# Patient Record
Sex: Female | Born: 1950
Health system: Southern US, Community
[De-identification: ages and names within clinical notes are randomized; demographics above are authoritative.]

## PROBLEM LIST (undated history)

## (undated) DIAGNOSIS — M1990 Unspecified osteoarthritis, unspecified site: Secondary | ICD-10-CM

## (undated) HISTORY — PX: COLONOSCOPY: SHX174

## (undated) HISTORY — PX: PARTIAL HYSTERECTOMY: SHX80

## (undated) HISTORY — PX: LESION EXCISION: SHX5167

## (undated) HISTORY — PX: CHOLECYSTECTOMY: SHX55

## (undated) HISTORY — PX: MASS EXCISION: SHX2000

## (undated) HISTORY — PX: ABDOMINAL HYSTERECTOMY: SHX81

---

## 2006-03-23 ENCOUNTER — Ambulatory Visit: Payer: Self-pay | Admitting: Internal Medicine

## 2016-04-17 DIAGNOSIS — H109 Unspecified conjunctivitis: Secondary | ICD-10-CM | POA: Diagnosis not present

## 2016-04-17 DIAGNOSIS — J069 Acute upper respiratory infection, unspecified: Secondary | ICD-10-CM | POA: Diagnosis not present

## 2016-04-17 DIAGNOSIS — J029 Acute pharyngitis, unspecified: Secondary | ICD-10-CM | POA: Diagnosis not present

## 2016-10-21 DIAGNOSIS — H524 Presbyopia: Secondary | ICD-10-CM | POA: Diagnosis not present

## 2016-10-21 DIAGNOSIS — H2513 Age-related nuclear cataract, bilateral: Secondary | ICD-10-CM | POA: Diagnosis not present

## 2016-10-21 DIAGNOSIS — H52223 Regular astigmatism, bilateral: Secondary | ICD-10-CM | POA: Diagnosis not present

## 2016-10-21 DIAGNOSIS — H5213 Myopia, bilateral: Secondary | ICD-10-CM | POA: Diagnosis not present

## 2016-10-21 DIAGNOSIS — H50011 Monocular esotropia, right eye: Secondary | ICD-10-CM | POA: Diagnosis not present

## 2016-10-21 DIAGNOSIS — H43813 Vitreous degeneration, bilateral: Secondary | ICD-10-CM | POA: Diagnosis not present

## 2017-08-03 DIAGNOSIS — J40 Bronchitis, not specified as acute or chronic: Secondary | ICD-10-CM | POA: Diagnosis not present

## 2017-11-05 DIAGNOSIS — C44319 Basal cell carcinoma of skin of other parts of face: Secondary | ICD-10-CM | POA: Diagnosis not present

## 2017-11-05 DIAGNOSIS — D485 Neoplasm of uncertain behavior of skin: Secondary | ICD-10-CM | POA: Diagnosis not present

## 2017-11-05 DIAGNOSIS — C44311 Basal cell carcinoma of skin of nose: Secondary | ICD-10-CM | POA: Diagnosis not present

## 2017-11-05 DIAGNOSIS — M799 Soft tissue disorder, unspecified: Secondary | ICD-10-CM | POA: Diagnosis not present

## 2017-11-29 DIAGNOSIS — D485 Neoplasm of uncertain behavior of skin: Secondary | ICD-10-CM | POA: Diagnosis not present

## 2017-12-28 DIAGNOSIS — D2111 Benign neoplasm of connective and other soft tissue of right upper limb, including shoulder: Secondary | ICD-10-CM | POA: Diagnosis not present

## 2017-12-28 DIAGNOSIS — R2231 Localized swelling, mass and lump, right upper limb: Secondary | ICD-10-CM | POA: Diagnosis not present

## 2017-12-28 DIAGNOSIS — D487 Neoplasm of uncertain behavior of other specified sites: Secondary | ICD-10-CM | POA: Diagnosis not present

## 2017-12-28 DIAGNOSIS — D481 Neoplasm of uncertain behavior of connective and other soft tissue: Secondary | ICD-10-CM | POA: Diagnosis not present

## 2017-12-28 DIAGNOSIS — E669 Obesity, unspecified: Secondary | ICD-10-CM | POA: Diagnosis not present

## 2017-12-29 DIAGNOSIS — L821 Other seborrheic keratosis: Secondary | ICD-10-CM | POA: Diagnosis not present

## 2017-12-29 DIAGNOSIS — L814 Other melanin hyperpigmentation: Secondary | ICD-10-CM | POA: Diagnosis not present

## 2017-12-29 DIAGNOSIS — L578 Other skin changes due to chronic exposure to nonionizing radiation: Secondary | ICD-10-CM | POA: Diagnosis not present

## 2017-12-29 DIAGNOSIS — L570 Actinic keratosis: Secondary | ICD-10-CM | POA: Diagnosis not present

## 2017-12-29 DIAGNOSIS — C44311 Basal cell carcinoma of skin of nose: Secondary | ICD-10-CM | POA: Diagnosis not present

## 2017-12-29 DIAGNOSIS — C44319 Basal cell carcinoma of skin of other parts of face: Secondary | ICD-10-CM | POA: Diagnosis not present

## 2018-03-09 DIAGNOSIS — R2231 Localized swelling, mass and lump, right upper limb: Secondary | ICD-10-CM | POA: Diagnosis not present

## 2018-05-09 DIAGNOSIS — L570 Actinic keratosis: Secondary | ICD-10-CM | POA: Diagnosis not present

## 2018-05-09 DIAGNOSIS — Z85828 Personal history of other malignant neoplasm of skin: Secondary | ICD-10-CM | POA: Diagnosis not present

## 2018-05-09 DIAGNOSIS — L821 Other seborrheic keratosis: Secondary | ICD-10-CM | POA: Diagnosis not present

## 2018-06-24 DIAGNOSIS — H50111 Monocular exotropia, right eye: Secondary | ICD-10-CM | POA: Diagnosis not present

## 2018-06-24 DIAGNOSIS — H524 Presbyopia: Secondary | ICD-10-CM | POA: Diagnosis not present

## 2018-06-24 DIAGNOSIS — H52223 Regular astigmatism, bilateral: Secondary | ICD-10-CM | POA: Diagnosis not present

## 2018-06-24 DIAGNOSIS — H2513 Age-related nuclear cataract, bilateral: Secondary | ICD-10-CM | POA: Diagnosis not present

## 2018-06-24 DIAGNOSIS — H5213 Myopia, bilateral: Secondary | ICD-10-CM | POA: Diagnosis not present

## 2018-07-19 DIAGNOSIS — H2513 Age-related nuclear cataract, bilateral: Secondary | ICD-10-CM | POA: Diagnosis not present

## 2018-08-30 DIAGNOSIS — J3089 Other allergic rhinitis: Secondary | ICD-10-CM | POA: Diagnosis not present

## 2018-08-30 DIAGNOSIS — H2512 Age-related nuclear cataract, left eye: Secondary | ICD-10-CM | POA: Diagnosis not present

## 2018-08-30 DIAGNOSIS — I10 Essential (primary) hypertension: Secondary | ICD-10-CM | POA: Diagnosis not present

## 2018-08-31 ENCOUNTER — Other Ambulatory Visit: Payer: Self-pay

## 2018-08-31 ENCOUNTER — Encounter: Payer: Self-pay | Admitting: *Deleted

## 2018-09-06 ENCOUNTER — Ambulatory Visit: Admission: RE | Admit: 2018-09-06 | Payer: PPO | Source: Home / Self Care | Admitting: Ophthalmology

## 2018-09-06 HISTORY — DX: Unspecified osteoarthritis, unspecified site: M19.90

## 2018-09-06 SURGERY — PHACOEMULSIFICATION, CATARACT, WITH IOL INSERTION
Anesthesia: Topical | Laterality: Left

## 2018-10-27 DIAGNOSIS — H2512 Age-related nuclear cataract, left eye: Secondary | ICD-10-CM | POA: Diagnosis not present

## 2018-10-31 ENCOUNTER — Other Ambulatory Visit: Payer: Self-pay

## 2018-10-31 ENCOUNTER — Encounter: Payer: Self-pay | Admitting: *Deleted

## 2018-11-03 ENCOUNTER — Other Ambulatory Visit: Admission: RE | Admit: 2018-11-03 | Payer: PPO | Source: Ambulatory Visit

## 2018-11-03 NOTE — Discharge Instructions (Signed)

## 2018-11-04 ENCOUNTER — Other Ambulatory Visit
Admission: RE | Admit: 2018-11-04 | Discharge: 2018-11-04 | Disposition: A | Discharge status: Home / Self Care | Payer: PPO | Source: Ambulatory Visit | Attending: Ophthalmology | Admitting: Ophthalmology

## 2018-11-04 ENCOUNTER — Other Ambulatory Visit: Payer: Self-pay

## 2018-11-04 DIAGNOSIS — Z1159 Encounter for screening for other viral diseases: Secondary | ICD-10-CM | POA: Insufficient documentation

## 2018-11-05 LAB — NOVEL CORONAVIRUS, NAA (HOSP ORDER, SEND-OUT TO REF LAB; TAT 18-24 HRS): SARS-CoV-2, NAA: NOT DETECTED

## 2018-11-08 ENCOUNTER — Other Ambulatory Visit: Payer: Self-pay

## 2018-11-08 ENCOUNTER — Ambulatory Visit: Payer: PPO | Admitting: Anesthesiology

## 2018-11-08 ENCOUNTER — Encounter
Admission: RE | Disposition: A | Discharge status: Home / Self Care | Payer: Self-pay | Source: Home / Self Care | Attending: Ophthalmology

## 2018-11-08 ENCOUNTER — Ambulatory Visit
Admission: RE | Admit: 2018-11-08 | Discharge: 2018-11-08 | Disposition: A | Discharge status: Home / Self Care | Payer: PPO | Attending: Ophthalmology | Admitting: Ophthalmology

## 2018-11-08 DIAGNOSIS — M1990 Unspecified osteoarthritis, unspecified site: Secondary | ICD-10-CM | POA: Insufficient documentation

## 2018-11-08 DIAGNOSIS — H2512 Age-related nuclear cataract, left eye: Secondary | ICD-10-CM | POA: Diagnosis not present

## 2018-11-08 DIAGNOSIS — Z87891 Personal history of nicotine dependence: Secondary | ICD-10-CM | POA: Insufficient documentation

## 2018-11-08 DIAGNOSIS — H25812 Combined forms of age-related cataract, left eye: Secondary | ICD-10-CM | POA: Diagnosis not present

## 2018-11-08 HISTORY — PX: CATARACT EXTRACTION W/PHACO: SHX586

## 2018-11-08 SURGERY — PHACOEMULSIFICATION, CATARACT, WITH IOL INSERTION
Anesthesia: Monitor Anesthesia Care | Site: Eye | Laterality: Left

## 2018-11-08 MED ORDER — MOXIFLOXACIN HCL 0.5 % OP SOLN
OPHTHALMIC | Status: DC | PRN
  Administered 2018-11-08: 0.2 mL via OPHTHALMIC

## 2018-11-08 MED ORDER — FENTANYL CITRATE (PF) 100 MCG/2ML IJ SOLN
INTRAMUSCULAR | Status: DC | PRN
  Administered 2018-11-08: 50 ug via INTRAVENOUS

## 2018-11-08 MED ORDER — EPINEPHRINE PF 1 MG/ML IJ SOLN
INTRAOCULAR | Status: DC | PRN
  Administered 2018-11-08: 93 mL via OPHTHALMIC

## 2018-11-08 MED ORDER — LACTATED RINGERS IV SOLN: 10.0000 mL/h | INTRAVENOUS | Status: DC

## 2018-11-08 MED ORDER — LIDOCAINE HCL (PF) 2 % IJ SOLN
INTRAOCULAR | Status: DC | PRN
  Administered 2018-11-08: 10:00:00 1 mL via INTRAOCULAR

## 2018-11-08 MED ORDER — SODIUM HYALURONATE 10 MG/ML IO SOLN
INTRAOCULAR | Status: DC | PRN
  Administered 2018-11-08: 0.55 mL via INTRAOCULAR

## 2018-11-08 MED ORDER — SODIUM HYALURONATE 23 MG/ML IO SOLN
INTRAOCULAR | Status: DC | PRN
  Administered 2018-11-08: 0.6 mL via INTRAOCULAR

## 2018-11-08 MED ORDER — ARMC OPHTHALMIC DILATING DROPS
1.0000 "application " | OPHTHALMIC | Status: DC | PRN
  Administered 2018-11-08 (×3): 1 via OPHTHALMIC

## 2018-11-08 MED ORDER — TETRACAINE HCL 0.5 % OP SOLN
1.0000 [drp] | OPHTHALMIC | Status: DC | PRN
  Administered 2018-11-08 (×3): 1 [drp] via OPHTHALMIC

## 2018-11-08 MED ORDER — MIDAZOLAM HCL 2 MG/2ML IJ SOLN
INTRAMUSCULAR | Status: DC | PRN
  Administered 2018-11-08: 1.5 mg via INTRAVENOUS

## 2018-11-08 SURGICAL SUPPLY — 19 items
CANNULA ANT/CHMB 27G (MISCELLANEOUS) ×2 IMPLANT
CANNULA ANT/CHMB 27GA (MISCELLANEOUS) ×6 IMPLANT
DISSECTOR HYDRO NUCLEUS 50X22 (MISCELLANEOUS) ×3 IMPLANT
GLOVE SURG LX 7.5 STRW (GLOVE) ×2
GLOVE SURG LX STRL 7.5 STRW (GLOVE) ×1 IMPLANT
GLOVE SURG SYN 8.5  E (GLOVE) ×2
GLOVE SURG SYN 8.5 E (GLOVE) ×1 IMPLANT
GLOVE SURG SYN 8.5 PF PI (GLOVE) ×1 IMPLANT
GOWN STRL REUS W/ TWL LRG LVL3 (GOWN DISPOSABLE) ×2 IMPLANT
GOWN STRL REUS W/TWL LRG LVL3 (GOWN DISPOSABLE) ×4
LENS IOL TECNIS ITEC 18.0 (Intraocular Lens) ×2 IMPLANT
MARKER SKIN DUAL TIP RULER LAB (MISCELLANEOUS) ×3 IMPLANT
PACK DR. KING ARMS (PACKS) ×3 IMPLANT
PACK EYE AFTER SURG (MISCELLANEOUS) ×3 IMPLANT
PACK OPTHALMIC (MISCELLANEOUS) ×3 IMPLANT
SYR 3ML LL SCALE MARK (SYRINGE) ×3 IMPLANT
SYR TB 1ML LUER SLIP (SYRINGE) ×3 IMPLANT
WATER STERILE IRR 250ML POUR (IV SOLUTION) ×3 IMPLANT
WIPE NON LINTING 3.25X3.25 (MISCELLANEOUS) ×3 IMPLANT

## 2018-11-08 NOTE — H&P (Signed)

## 2018-11-08 NOTE — Transfer of Care (Signed)
Immediate Anesthesia Transfer of Care Note  Patient: Haley Morris  Procedure(s) Performed: CATARACT EXTRACTION PHACO AND INTRAOCULAR LENS PLACEMENT (IOC) LEFT (Left Eye)  Patient Location: PACU  Anesthesia Type: MAC  Level of Consciousness: awake, alert  and patient cooperative  Airway and Oxygen Therapy: Patient Spontanous Breathing and Patient connected to supplemental oxygen  Post-op Assessment: Post-op Vital signs reviewed, Patient's Cardiovascular Status Stable, Respiratory Function Stable, Patent Airway and No signs of Nausea or vomiting  Post-op Vital Signs: Reviewed and stable  Complications: No apparent anesthesia complications

## 2018-11-08 NOTE — Anesthesia Procedure Notes (Signed)
Procedure Name: MAC Performed by: Leshon Armistead, CRNA Pre-anesthesia Checklist: Patient identified, Emergency Drugs available, Suction available, Timeout performed and Patient being monitored Patient Re-evaluated:Patient Re-evaluated prior to induction Oxygen Delivery Method: Nasal cannula Placement Confirmation: positive ETCO2       

## 2018-11-08 NOTE — Anesthesia Preprocedure Evaluation (Signed)
Anesthesia Evaluation  Patient identified by MRN, date of birth, ID band Patient awake    Reviewed: Allergy & Precautions, H&P , NPO status , Patient's Chart, lab work & pertinent test results  History of Anesthesia Complications Negative for: history of anesthetic complications  Airway Mallampati: II  TM Distance: >3 FB Neck ROM: full    Dental no notable dental hx.    Pulmonary former smoker,    Pulmonary exam normal        Cardiovascular negative cardio ROS Normal cardiovascular exam     Neuro/Psych    GI/Hepatic negative GI ROS, Neg liver ROS,   Endo/Other  negative endocrine ROS  Renal/GU negative Renal ROS     Musculoskeletal   Abdominal   Peds  Hematology negative hematology ROS (+)   Anesthesia Other Findings   Reproductive/Obstetrics                             Anesthesia Physical Anesthesia Plan  ASA: II  Anesthesia Plan: MAC   Post-op Pain Management:    Induction:   PONV Risk Score and Plan:   Airway Management Planned:   Additional Equipment:   Intra-op Plan:   Post-operative Plan:   Informed Consent: I have reviewed the patients History and Physical, chart, labs and discussed the procedure including the risks, benefits and alternatives for the proposed anesthesia with the patient or authorized representative who has indicated his/her understanding and acceptance.       Plan Discussed with:   Anesthesia Plan Comments:         Anesthesia Quick Evaluation

## 2018-11-08 NOTE — Anesthesia Postprocedure Evaluation (Signed)
Anesthesia Post Note  Patient: Haley Morris  Procedure(s) Performed: CATARACT EXTRACTION PHACO AND INTRAOCULAR LENS PLACEMENT (IOC) LEFT (Left Eye)  Patient location during evaluation: PACU Anesthesia Type: MAC Level of consciousness: awake and alert Pain management: pain level controlled Vital Signs Assessment: post-procedure vital signs reviewed and stable Respiratory status: spontaneous breathing Cardiovascular status: stable Anesthetic complications: no    Hasana Alcorta, III,  Endy Easterly D

## 2018-11-08 NOTE — Op Note (Signed)
OPERATIVE NOTE  Haley Morris [id number] 11/08/2018   PREOPERATIVE DIAGNOSIS:  Nuclear sclerotic cataract left eye.  H25.12   POSTOPERATIVE DIAGNOSIS:    Nuclear sclerotic cataract left eye.     PROCEDURE:  Phacoemusification with posterior chamber intraocular lens placement of the left eye   LENS:   Implant Name Type Inv. Item Serial No. Manufacturer Lot No. LRB No. Used  LENS IOL DIOP 18.0 - [id number] Intraocular Lens LENS IOL DIOP 18.0 [id number] AMO  Left 1       PCB00 +18.0   ULTRASOUND TIME: 1 minutes 07 seconds.  CDE 12.11   SURGEON:  Benay Pillow, MD, MPH   ANESTHESIA:  Topical with tetracaine drops augmented with 1% preservative-free intracameral lidocaine.  ESTIMATED BLOOD LOSS: <1 mL   COMPLICATIONS:  None.   DESCRIPTION OF PROCEDURE:  The patient was identified in the holding room and transported to the operating room and placed in the supine position under the operating microscope.  The left eye was identified as the operative eye and it was prepped and draped in the usual sterile ophthalmic fashion.   A 1.0 millimeter clear-corneal paracentesis was made at the 5:00 position. 0.5 ml of preservative-free 1% lidocaine with epinephrine was injected into the anterior chamber.  The anterior chamber was filled with Healon 5 viscoelastic.  A 2.4 millimeter keratome was used to make a near-clear corneal incision at the 2:00 position.  A curvilinear capsulorrhexis was made with a cystotome and capsulorrhexis forceps.  Balanced salt solution was used to hydrodissect and hydrodelineate the nucleus.   Phacoemulsification was then used in stop and chop fashion to remove the lens nucleus and epinucleus.  The remaining cortex was then removed using the irrigation and aspiration handpiece. Healon was then placed into the capsular bag to distend it for lens placement.  A lens was then injected into the capsular bag.  The remaining viscoelastic was aspirated.   Wounds were hydrated  with balanced salt solution.  The anterior chamber was inflated to a physiologic pressure with balanced salt solution.  Intracameral vigamox 0.1 mL undiltued was injected into the eye and a drop placed onto the ocular surface.  No wound leaks were noted.  The patient was taken to the recovery room in stable condition without complications of anesthesia or surgery  Benay Pillow 11/08/2018, 10:19 AM

## 2018-11-09 ENCOUNTER — Encounter: Payer: Self-pay | Admitting: Ophthalmology

## 2018-11-20 DIAGNOSIS — H2511 Age-related nuclear cataract, right eye: Secondary | ICD-10-CM | POA: Diagnosis not present

## 2018-11-29 ENCOUNTER — Other Ambulatory Visit: Payer: Self-pay

## 2018-11-29 ENCOUNTER — Encounter: Payer: Self-pay | Admitting: *Deleted

## 2018-12-01 ENCOUNTER — Other Ambulatory Visit: Payer: Self-pay

## 2018-12-01 ENCOUNTER — Other Ambulatory Visit
Admission: RE | Admit: 2018-12-01 | Discharge: 2018-12-01 | Disposition: A | Discharge status: Home / Self Care | Payer: PPO | Source: Ambulatory Visit | Attending: Ophthalmology | Admitting: Ophthalmology

## 2018-12-01 DIAGNOSIS — Z1159 Encounter for screening for other viral diseases: Secondary | ICD-10-CM | POA: Insufficient documentation

## 2018-12-01 NOTE — Discharge Instructions (Signed)

## 2018-12-02 LAB — NOVEL CORONAVIRUS, NAA (HOSP ORDER, SEND-OUT TO REF LAB; TAT 18-24 HRS): SARS-CoV-2, NAA: NOT DETECTED

## 2018-12-05 ENCOUNTER — Encounter
Admission: RE | Disposition: A | Discharge status: Home / Self Care | Payer: Self-pay | Source: Home / Self Care | Attending: Ophthalmology

## 2018-12-05 ENCOUNTER — Other Ambulatory Visit: Payer: Self-pay

## 2018-12-05 ENCOUNTER — Ambulatory Visit: Payer: PPO | Admitting: Anesthesiology

## 2018-12-05 ENCOUNTER — Ambulatory Visit
Admission: RE | Admit: 2018-12-05 | Discharge: 2018-12-05 | Disposition: A | Discharge status: Home / Self Care | Payer: PPO | Attending: Ophthalmology | Admitting: Ophthalmology

## 2018-12-05 DIAGNOSIS — H2511 Age-related nuclear cataract, right eye: Secondary | ICD-10-CM | POA: Diagnosis not present

## 2018-12-05 DIAGNOSIS — Z87891 Personal history of nicotine dependence: Secondary | ICD-10-CM | POA: Insufficient documentation

## 2018-12-05 DIAGNOSIS — H25811 Combined forms of age-related cataract, right eye: Secondary | ICD-10-CM | POA: Diagnosis not present

## 2018-12-05 HISTORY — PX: CATARACT EXTRACTION W/PHACO: SHX586

## 2018-12-05 SURGERY — PHACOEMULSIFICATION, CATARACT, WITH IOL INSERTION
Anesthesia: Monitor Anesthesia Care | Site: Eye | Laterality: Right

## 2018-12-05 MED ORDER — LIDOCAINE HCL (PF) 2 % IJ SOLN
INTRAOCULAR | Status: DC | PRN
  Administered 2018-12-05: 2 mL via INTRAOCULAR

## 2018-12-05 MED ORDER — LACTATED RINGERS IV SOLN: INTRAVENOUS | Status: DC

## 2018-12-05 MED ORDER — LIDOCAINE HCL (PF) 2 % IJ SOLN
INTRAMUSCULAR | Status: DC | PRN
  Administered 2018-12-05: 4.5 mL via INTRAMUSCULAR

## 2018-12-05 MED ORDER — ARMC OPHTHALMIC DILATING DROPS
1.0000 "application " | OPHTHALMIC | Status: DC | PRN
  Administered 2018-12-05 (×3): 1 via OPHTHALMIC

## 2018-12-05 MED ORDER — NEOMYCIN-POLYMYXIN-DEXAMETH 3.5-10000-0.1 OP OINT
TOPICAL_OINTMENT | OPHTHALMIC | Status: DC | PRN
  Administered 2018-12-05: 1 via OPHTHALMIC

## 2018-12-05 MED ORDER — MIDAZOLAM HCL 2 MG/2ML IJ SOLN
INTRAMUSCULAR | Status: DC | PRN
  Administered 2018-12-05: 2 mg via INTRAVENOUS

## 2018-12-05 MED ORDER — SODIUM HYALURONATE 10 MG/ML IO SOLN
INTRAOCULAR | Status: DC | PRN
  Administered 2018-12-05: 0.55 mL via INTRAOCULAR

## 2018-12-05 MED ORDER — TETRACAINE HCL 0.5 % OP SOLN
1.0000 [drp] | OPHTHALMIC | Status: DC | PRN
  Administered 2018-12-05 (×3): 1 [drp] via OPHTHALMIC

## 2018-12-05 MED ORDER — ONDANSETRON HCL 4 MG/2ML IJ SOLN: 4.0000 mg | Freq: Once | INTRAMUSCULAR | Status: DC | PRN

## 2018-12-05 MED ORDER — ALFENTANIL 500 MCG/ML IJ INJ
INJECTION | INTRAVENOUS | Status: DC | PRN
  Administered 2018-12-05: 500 ug via INTRAVENOUS

## 2018-12-05 MED ORDER — SODIUM HYALURONATE 23 MG/ML IO SOLN
INTRAOCULAR | Status: DC | PRN
  Administered 2018-12-05: 0.6 mL via INTRAOCULAR

## 2018-12-05 MED ORDER — EPINEPHRINE PF 1 MG/ML IJ SOLN
INTRAOCULAR | Status: DC | PRN
  Administered 2018-12-05: 95 mL via OPHTHALMIC

## 2018-12-05 MED ORDER — MOXIFLOXACIN HCL 0.5 % OP SOLN
OPHTHALMIC | Status: DC | PRN
  Administered 2018-12-05: 0.2 mL via OPHTHALMIC

## 2018-12-05 SURGICAL SUPPLY — 19 items
CANNULA ANT/CHMB 27G (MISCELLANEOUS) ×2 IMPLANT
CANNULA ANT/CHMB 27GA (MISCELLANEOUS) ×4 IMPLANT
DISSECTOR HYDRO NUCLEUS 50X22 (MISCELLANEOUS) ×2 IMPLANT
GLOVE SURG LX 7.5 STRW (GLOVE) ×2
GLOVE SURG LX STRL 7.5 STRW (GLOVE) ×1 IMPLANT
GLOVE SURG SYN 8.5  E (GLOVE) ×1
GLOVE SURG SYN 8.5 E (GLOVE) ×1 IMPLANT
GLOVE SURG SYN 8.5 PF PI (GLOVE) ×1 IMPLANT
GOWN STRL REUS W/ TWL LRG LVL3 (GOWN DISPOSABLE) ×2 IMPLANT
GOWN STRL REUS W/TWL LRG LVL3 (GOWN DISPOSABLE) ×2
LENS IOL TECNIS ITEC 16.5 (Intraocular Lens) ×1 IMPLANT
MARKER SKIN DUAL TIP RULER LAB (MISCELLANEOUS) ×2 IMPLANT
PACK DR. KING ARMS (PACKS) ×2 IMPLANT
PACK EYE AFTER SURG (MISCELLANEOUS) ×2 IMPLANT
PACK OPTHALMIC (MISCELLANEOUS) ×2 IMPLANT
SYR 3ML LL SCALE MARK (SYRINGE) ×2 IMPLANT
SYR TB 1ML LUER SLIP (SYRINGE) ×2 IMPLANT
WATER STERILE IRR 250ML POUR (IV SOLUTION) ×2 IMPLANT
WIPE NON LINTING 3.25X3.25 (MISCELLANEOUS) ×2 IMPLANT

## 2018-12-05 NOTE — Transfer of Care (Signed)
Immediate Anesthesia Transfer of Care Note  Patient: Haley Morris  Procedure(s) Performed: CATARACT EXTRACTION PHACO AND INTRAOCULAR LENS PLACEMENT (IOC)  RIGHT (Right Eye)  Patient Location: PACU  Anesthesia Type: MAC  Level of Consciousness: awake, alert  and patient cooperative  Airway and Oxygen Therapy: Patient Spontanous Breathing and Patient connected to supplemental oxygen  Post-op Assessment: Post-op Vital signs reviewed, Patient's Cardiovascular Status Stable, Respiratory Function Stable, Patent Airway and No signs of Nausea or vomiting  Post-op Vital Signs: Reviewed and stable  Complications: No apparent anesthesia complications

## 2018-12-05 NOTE — H&P (Signed)

## 2018-12-05 NOTE — Anesthesia Preprocedure Evaluation (Signed)
Anesthesia Evaluation  Patient identified by MRN, date of birth, ID band Patient awake    Reviewed: Allergy & Precautions, H&P , NPO status , Patient's Chart, lab work & pertinent test results  History of Anesthesia Complications Negative for: history of anesthetic complications  Airway Mallampati: II  TM Distance: >3 FB Neck ROM: full    Dental no notable dental hx.    Pulmonary former smoker,    Pulmonary exam normal breath sounds clear to auscultation       Cardiovascular negative cardio ROS Normal cardiovascular exam Rhythm:regular Rate:Normal     Neuro/Psych    GI/Hepatic negative GI ROS, Neg liver ROS,   Endo/Other  negative endocrine ROS  Renal/GU negative Renal ROS     Musculoskeletal   Abdominal   Peds  Hematology negative hematology ROS (+)   Anesthesia Other Findings   Reproductive/Obstetrics                             Anesthesia Physical  Anesthesia Plan  ASA: II  Anesthesia Plan: MAC   Post-op Pain Management:    Induction:   PONV Risk Score and Plan: 2 and Midazolam and Treatment may vary due to age or medical condition  Airway Management Planned:   Additional Equipment:   Intra-op Plan:   Post-operative Plan:   Informed Consent: I have reviewed the patients History and Physical, chart, labs and discussed the procedure including the risks, benefits and alternatives for the proposed anesthesia with the patient or authorized representative who has indicated his/her understanding and acceptance.       Plan Discussed with: CRNA  Anesthesia Plan Comments:         Anesthesia Quick Evaluation

## 2018-12-05 NOTE — Op Note (Signed)
OPERATIVE NOTE  Haley Morris [id number] 12/05/2018   PREOPERATIVE DIAGNOSIS:  Nuclear sclerotic cataract right eye.  H25.11   POSTOPERATIVE DIAGNOSIS:    Nuclear sclerotic cataract right eye.     PROCEDURE:  Phacoemusification with posterior chamber intraocular lens placement of the right eye   LENS:   Implant Name Type Inv. Item Serial No. Manufacturer Lot No. LRB No. Used Action  LENS IOL DIOP 16.5 - [id number] Intraocular Lens LENS IOL DIOP 16.5 [id number] AMO  Right 1 Implanted       PCB00 +16.5   ULTRASOUND TIME: 1 minutes 37 seconds.  CDE 18.22   SURGEON:  Benay Pillow, MD, MPH  ANESTHESIOLOGIST: Anesthesiologist: Ronelle Nigh, MD CRNA: Jeannene Patella, CRNA   ANESTHESIA:  Topical with tetracaine drops augmented with 1% preservative-free intracameral lidocaine.  ESTIMATED BLOOD LOSS: less than 1 mL.   COMPLICATIONS:  None.   DESCRIPTION OF PROCEDURE:  The patient was identified in the holding room and transported to the operating room and placed in the supine position under the operating microscope.  The right eye was identified as the operative eye.  A retrobulbar block was performed in the standard fashion with a 1.5 in 25 ga needle, 0.75% bupivicaine, 2% lidocaine, small amount of vitrase.  The right eye was prepped and draped in the usual sterile ophthalmic fashion.    A 1.0 millimeter clear-corneal paracentesis was made at the 10:30 position. 0.5 ml of preservative-free 1% lidocaine with epinephrine was injected into the anterior chamber.  The anterior chamber was filled with Healon 5 viscoelastic.  A 2.4 millimeter keratome was used to make a near-clear corneal incision at the 8:00 position.  A curvilinear capsulorrhexis was made with a cystotome and capsulorrhexis forceps.  Balanced salt solution was used to hydrodissect and hydrodelineate the nucleus.   Phacoemulsification was then used in stop and chop fashion to remove the lens nucleus and  epinucleus.  The remaining cortex was then removed using the irrigation and aspiration handpiece. Healon was then placed into the capsular bag to distend it for lens placement.  A lens was then injected into the capsular bag.  The remaining viscoelastic was aspirated.   Wounds were hydrated with balanced salt solution.  The anterior chamber was inflated to a physiologic pressure with balanced salt solution.   Intracameral vigamox 0.1 mL undiluted was injected into the eye and a drop placed onto the ocular surface.  The eye was patched and shielded.  No wound leaks were noted.  The patient was taken to the recovery room in stable condition without complications of anesthesia or surgery  Benay Pillow 12/05/2018, 10:11 AM

## 2018-12-05 NOTE — Anesthesia Postprocedure Evaluation (Signed)
Anesthesia Post Note  Patient: Haley Morris  Procedure(s) Performed: CATARACT EXTRACTION PHACO AND INTRAOCULAR LENS PLACEMENT (IOC)  RIGHT (Right Eye)  Patient location during evaluation: PACU Anesthesia Type: MAC Level of consciousness: awake and alert and oriented Pain management: satisfactory to patient Vital Signs Assessment: post-procedure vital signs reviewed and stable Respiratory status: spontaneous breathing, nonlabored ventilation and respiratory function stable Cardiovascular status: blood pressure returned to baseline and stable Postop Assessment: Adequate PO intake and No signs of nausea or vomiting Anesthetic complications: no    Raliegh Ip

## 2018-12-06 ENCOUNTER — Encounter: Payer: Self-pay | Admitting: Ophthalmology

## 2019-01-03 DIAGNOSIS — Z961 Presence of intraocular lens: Secondary | ICD-10-CM | POA: Diagnosis not present

## 2019-06-14 ENCOUNTER — Ambulatory Visit: Payer: PPO | Attending: Internal Medicine

## 2019-06-14 DIAGNOSIS — Z20822 Contact with and (suspected) exposure to covid-19: Secondary | ICD-10-CM

## 2019-06-15 ENCOUNTER — Telehealth: Payer: Self-pay

## 2019-06-15 NOTE — Telephone Encounter (Signed)
rec'd call from Bieber at [facility].  She reported [facility] rec'd specimen without a label on it, and the pt will need to retest.    Attempted to notify the pt.  Left vm on mobile phone to return call to [phone], to discuss need to retest for COVID.  When pt returns call, he should be made aware of need to retest, since the specimen that was sent to [facility] did not have an identifying label on it.

## 2019-06-19 LAB — NOVEL CORONAVIRUS, NAA

## 2019-06-30 DIAGNOSIS — Z1211 Encounter for screening for malignant neoplasm of colon: Secondary | ICD-10-CM | POA: Diagnosis not present

## 2019-08-22 ENCOUNTER — Encounter: Payer: Self-pay | Admitting: Family Medicine

## 2019-08-22 ENCOUNTER — Other Ambulatory Visit: Payer: Self-pay

## 2019-08-22 ENCOUNTER — Ambulatory Visit (INDEPENDENT_AMBULATORY_CARE_PROVIDER_SITE_OTHER): Payer: PPO | Admitting: Family Medicine

## 2019-08-22 VITALS — BP 142/84 | HR 104 | Temp 97.1°F | Resp 20 | Ht 60.0 in | Wt 214.8 lb

## 2019-08-22 DIAGNOSIS — Z13820 Encounter for screening for osteoporosis: Secondary | ICD-10-CM

## 2019-08-22 DIAGNOSIS — I10 Essential (primary) hypertension: Secondary | ICD-10-CM | POA: Insufficient documentation

## 2019-08-22 DIAGNOSIS — R030 Elevated blood-pressure reading, without diagnosis of hypertension: Secondary | ICD-10-CM

## 2019-08-22 DIAGNOSIS — Z6841 Body Mass Index (BMI) 40.0 and over, adult: Secondary | ICD-10-CM

## 2019-08-22 DIAGNOSIS — Z1159 Encounter for screening for other viral diseases: Secondary | ICD-10-CM | POA: Diagnosis not present

## 2019-08-22 DIAGNOSIS — Z114 Encounter for screening for human immunodeficiency virus [HIV]: Secondary | ICD-10-CM | POA: Diagnosis not present

## 2019-08-22 DIAGNOSIS — Z1231 Encounter for screening mammogram for malignant neoplasm of breast: Secondary | ICD-10-CM

## 2019-08-22 DIAGNOSIS — Z7689 Persons encountering health services in other specified circumstances: Secondary | ICD-10-CM

## 2019-08-22 NOTE — Assessment & Plan Note (Signed)
New patient establishing here from [facility].  Reports was there last many years ago, records requested for review.    Will return to clinic in 4 weeks to re-evaluate blood pressure after medical records received from [facility] to follow up on any previously documented diagnoses and/or chronic conditions not reported today.

## 2019-08-22 NOTE — Patient Instructions (Addendum)
Have your labs drawn and we will contact you with the results.  Your blood pressure was elevated today in clinic, we will see you back in 1 month for recheck.  We will contact [facility] to obtain copies of your old medical records.  For Mammogram screening for breast cancer DEXA Scan (Bone mineral density) screening for osteoporosis  Call the [place] below anytime to schedule your own appointment now that order has been placed.  [facility] [address] [place], [place] [address] Phone: [phone]   [facility] [address] [place], [place] [address] Phone: [phone]   We will plan to see you back in 1 months for blood pressure recheck  You will receive a survey after today's visit either digitally by e-mail or paper by [facility] mail. Your experiences and feedback matter to [place].  Please respond so we know how we are doing as we provide care for you.  Call us with any questions/concerns/needs.  It is my goal to be available to you for your health concerns.  Thanks for choosing me to be a partner in your healthcare needs!  Harlin Rain, FNP-C Family Nurse Practitioner [facility] Phone: [phone]

## 2019-08-22 NOTE — Assessment & Plan Note (Signed)
Blood pressure elevated in clinic on initial reading and repeat vitals at end of visit.  Has blood pressure cuff at home and will take measurements 2x a day, logging them, to bring back to clinic in 4 weeks for re-evaluation of blood pressure readings.

## 2019-08-22 NOTE — Progress Notes (Signed)
Subjective:    Patient ID: Haley Morris, female    DOB: November 21, 1950, 69 y.o.   MRN: [id number]  Haley Morris is a 69 y.o. female presenting on 08/22/2019 for Establish Care   HPI   Previous PCP was at [facility].  Records will be requested.  Past medical, family, and surgical history reviewed w/ pt.   HEALTH MAINTENANCE:  Weight/BMI: BMI 41.95, to work on weight reduction Physical activity: No structured physical activity Diet: Regular diet Seatbelt: 100% of the time Sunscreen: Very seldom in the sun, wears sunscreen when out in the sun Mammogram: Ordered today DEXA: Ordered today Colon cancer screening: Reports had sent stool sample to her insurance company in January - no records to review  HIV & Hep C Screening: Ordered today Optometry: Sees again in July Dentistry: Every 6 months  IMMUNIZATIONS: Influenza: Discussed and declined Tetanus: Unsure - will review PCP records COVID: Discussed and declined Pneumonia: Unsure - will review PCP records  Depression screen [facility] 2/9 08/22/2019  Decreased Interest 0  Down, Depressed, Hopeless 0  PHQ - 2 Score 0    Past Medical History:  Diagnosis Date  . Arthritis    knees   Past Surgical History:  Procedure Laterality Date  . CATARACT EXTRACTION W/PHACO Left 11/08/2018   Procedure: CATARACT EXTRACTION PHACO AND INTRAOCULAR LENS PLACEMENT ([place]) LEFT;  Surgeon: Eulogio Bear, MD;  Location: [facility];  Service: Ophthalmology;  Laterality: Left;  . CATARACT EXTRACTION W/PHACO Right 12/05/2018   Procedure: CATARACT EXTRACTION PHACO AND INTRAOCULAR LENS PLACEMENT ([place])  RIGHT;  Surgeon: Eulogio Bear, MD;  Location: [facility];  Service: Ophthalmology;  Laterality: Right;  . CHOLECYSTECTOMY    . COLONOSCOPY    . LESION EXCISION     nose  . MASS EXCISION Left    thumb  . PARTIAL HYSTERECTOMY     Social History   Socioeconomic History  . Marital status: Married    Spouse name: Not on file    . Number of children: Not on file  . Years of education: Not on file  . Highest education level: Not on file  Occupational History  . Not on file  Tobacco Use  . Smoking status: Former Research scientist (life sciences)  . Smokeless tobacco: Never Used  . Tobacco comment: socially in her 50s  Substance and Sexual Activity  . Alcohol use: Not Currently  . Drug use: Not Currently  . Sexual activity: Not Currently  Other Topics Concern  . Not on file  Social History Narrative  . Not on file   Social Determinants of Health   Financial Resource Strain:   . Difficulty of Paying Living Expenses: Not on file  Food Insecurity:   . Worried About Charity fundraiser in the Last Year: Not on file  . Ran Out of Food in the Last Year: Not on file  Transportation Needs:   . Lack of Transportation (Medical): Not on file  . Lack of Transportation (Non-Medical): Not on file  Physical Activity:   . Days of Exercise per Week: Not on file  . Minutes of Exercise per Session: Not on file  Stress:   . Feeling of Stress : Not on file  Social Connections:   . Frequency of Communication with Friends and Family: Not on file  . Frequency of Social Gatherings with Friends and Family: Not on file  . Attends Religious Services: Not on file  . Active Member of Clubs or Organizations: Not on  file  . Attends Archivist Meetings: Not on file  . Marital Status: Not on file  Intimate Partner Violence:   . Fear of Current or Ex-Partner: Not on file  . Emotionally Abused: Not on file  . Physically Abused: Not on file  . Sexually Abused: Not on file   Family History  Problem Relation Age of Onset  . Heart attack Father   . Lung cancer Brother    Current Outpatient Medications on File Prior to Visit  Medication Sig  . Ascorbic Acid (VITAMIN C PO) Take by mouth daily.  . B Complex Vitamins (VITAMIN B COMPLEX PO) Take by mouth daily.  Marland Kitchen CINNAMON PO Take by mouth daily.  Marland Kitchen ELDERBERRY PO Take by mouth.  . Ginger,  Zingiber officinalis, (GINGER ROOT) 550 MG CAPS Take by mouth.  . Multiple Vitamin (MULTIVITAMIN) tablet Take 1 tablet by mouth daily.  . TURMERIC PO Take by mouth daily.  Marland Kitchen acetaminophen (TYLENOL) 500 MG tablet Take 500 mg by mouth every 6 (six) hours as needed.   No current facility-administered medications on file prior to visit.    Per HPI unless specifically indicated above     Objective:    BP (!) 142/84 (BP Location: Left Arm, Patient Position: Sitting, Cuff Size: Large)   Pulse (!) 104   Temp (!) 97.1 F (36.2 C) (Temporal)   Resp 20   Ht 5' (1.524 m)   Wt 214 lb 12.8 oz (97.4 kg)   SpO2 97%   BMI 41.95 kg/m   Wt Readings from Last 3 Encounters:  08/22/19 214 lb 12.8 oz (97.4 kg)  12/05/18 225 lb 1.4 oz (102.1 kg)  11/08/18 224 lb (101.6 kg)    Physical Exam Vitals reviewed.  Constitutional:      General: She is not in acute distress.    Appearance: Normal appearance. She is well-groomed and overweight. She is not ill-appearing or toxic-appearing.  HENT:     Head: Normocephalic.     Right Ear: Tympanic membrane, ear canal and external ear normal. There is no impacted cerumen.     Left Ear: Tympanic membrane, ear canal and external ear normal. There is no impacted cerumen.     Nose: Nose normal. No congestion or rhinorrhea.     Mouth/Throat:     Lips: Pink.     Mouth: Mucous membranes are moist.     Pharynx: Oropharynx is clear. Uvula midline. No oropharyngeal exudate or posterior oropharyngeal erythema.  Eyes:     General: Lids are normal. Vision grossly intact. No scleral icterus.       Right eye: No discharge.        Left eye: No discharge.     Extraocular Movements: Extraocular movements intact.     Conjunctiva/sclera: Conjunctivae normal.     Pupils: Pupils are equal, round, and reactive to light.     Comments: Right eye inner deviation.  Follows with Ophthalmology.  Neck:     Thyroid: No thyroid mass or thyromegaly.  Cardiovascular:     Rate and  Rhythm: Normal rate and regular rhythm.     Pulses: Normal pulses.          Dorsalis pedis pulses are 2+ on the right side and 2+ on the left side.       Posterior tibial pulses are 2+ on the right side and 2+ on the left side.     Heart sounds: Normal heart sounds. No murmur. No friction rub. No gallop.  Pulmonary:     Effort: Pulmonary effort is normal. No respiratory distress.     Breath sounds: Normal breath sounds.  Abdominal:     General: Abdomen is flat. Bowel sounds are normal. There is no distension.     Palpations: Abdomen is soft. There is no hepatomegaly, splenomegaly or mass.     Tenderness: There is no abdominal tenderness. There is no guarding or rebound.     Hernia: No hernia is present.  Musculoskeletal:        General: Normal range of motion.     Cervical back: Normal range of motion and neck supple. No tenderness.     Right lower leg: No edema.     Left lower leg: No edema.  Feet:     Right foot:     Skin integrity: Skin integrity normal.     Left foot:     Skin integrity: Skin integrity normal.  Lymphadenopathy:     Cervical: No cervical adenopathy.  Skin:    General: Skin is warm and dry.     Capillary Refill: Capillary refill takes less than 2 seconds.  Neurological:     General: No focal deficit present.     Mental Status: She is alert and oriented to person, place, and time.     Cranial Nerves: No cranial nerve deficit.     Sensory: No sensory deficit.     Motor: No weakness.     Coordination: Coordination normal.     Gait: Gait normal.     Deep Tendon Reflexes: Reflexes normal.  Psychiatric:        Attention and Perception: Attention and perception normal.        Mood and Affect: Mood and affect normal.        Speech: Speech normal.        Behavior: Behavior normal. Behavior is cooperative.        Thought Content: Thought content normal.        Cognition and Memory: Cognition and memory normal.        Judgment: Judgment normal.     Results for  orders placed or performed in visit on 06/14/19  Novel Coronavirus, NAA ([facility])   Specimen: Nasopharyngeal(NP) swabs in vial transport medium   NASOPHARYNGE  TESTING  Result Value Ref Range   SARS-CoV-2, NAA CANCELED       Assessment & Plan:   Problem List Items Addressed This Visit      Other   Elevated blood pressure reading without diagnosis of hypertension    Blood pressure elevated in clinic on initial reading and repeat vitals at end of visit.  Has blood pressure cuff at home and will take measurements 2x a day, logging them, to bring back to clinic in 4 weeks for re-evaluation of blood pressure readings.        Encounter to establish care with new doctor    New patient establishing here from [facility].  Reports was there last many years ago, records requested for review.    Will return to clinic in 4 weeks to re-evaluate blood pressure after medical records received from [facility] to follow up on any previously documented diagnoses and/or chronic conditions not reported today.       Other Visit Diagnoses    Encounter for screening mammogram for malignant neoplasm of breast    -  Primary   Relevant Orders   MM Digital Screening   Screening for osteoporosis       Relevant  Orders   DG Bone Density   Encounter for hepatitis C screening test for low risk patient       Relevant Orders   Hepatitis C Antibody   Screening for HIV (human immunodeficiency virus)       Relevant Orders   HIV antibody (with reflex)   BMI 40.0-44.9, adult ([place])       Relevant Orders   CBC with Differential   COMPLETE METABOLIC PANEL WITH GFR   Lipid Profile   Thyroid Panel With TSH   Elevated blood-pressure reading without diagnosis of hypertension       Relevant Orders   CBC with Differential   COMPLETE METABOLIC PANEL WITH GFR   Lipid Profile      No orders of the defined types were placed in this encounter.     Follow up plan: Return in about 4 weeks (around 09/19/2019) for  BP recheck in 4 weeks.  Harlin Rain, FNP-C Family Nurse Practitioner [facility] 08/22/2019, 2:57 PM

## 2019-08-23 ENCOUNTER — Other Ambulatory Visit: Payer: PPO

## 2019-08-23 DIAGNOSIS — R030 Elevated blood-pressure reading, without diagnosis of hypertension: Secondary | ICD-10-CM | POA: Diagnosis not present

## 2019-08-23 DIAGNOSIS — Z1159 Encounter for screening for other viral diseases: Secondary | ICD-10-CM | POA: Diagnosis not present

## 2019-08-23 DIAGNOSIS — Z6841 Body Mass Index (BMI) 40.0 and over, adult: Secondary | ICD-10-CM | POA: Diagnosis not present

## 2019-08-23 DIAGNOSIS — Z114 Encounter for screening for human immunodeficiency virus [HIV]: Secondary | ICD-10-CM | POA: Diagnosis not present

## 2019-08-24 LAB — COMPLETE METABOLIC PANEL WITH GFR
AG Ratio: 1.4 (calc) (ref 1.0–2.5)
ALT: 21 U/L (ref 6–29)
AST: 20 U/L (ref 10–35)
Albumin: 4.3 g/dL (ref 3.6–5.1)
Alkaline phosphatase (APISO): 89 U/L (ref 37–153)
BUN: 20 mg/dL (ref 7–25)
CO2: 25 mmol/L (ref 20–32)
Calcium: 9.9 mg/dL (ref 8.6–10.4)
Chloride: 104 mmol/L (ref 98–110)
Creat: 0.7 mg/dL (ref 0.50–0.99)
GFR, Est African American: 103 mL/min/{1.73_m2} (ref >=60)
GFR, Est Non African American: 89 mL/min/{1.73_m2} (ref >=60)
Globulin: 3.1 g/dL (calc) (ref 1.9–3.7)
Glucose, Bld: 118 mg/dL — ABNORMAL HIGH (ref 65–99)
Potassium: 4.1 mmol/L (ref 3.5–5.3)
Sodium: 139 mmol/L (ref 135–146)
Total Bilirubin: 0.7 mg/dL (ref 0.2–1.2)
Total Protein: 7.4 g/dL (ref 6.1–8.1)

## 2019-08-24 LAB — THYROID PANEL WITH TSH
Free Thyroxine Index: 2.6 (ref 1.4–3.8)
T3 Uptake: 31 % (ref 22–35)
T4, Total: 8.3 ug/dL (ref 5.1–11.9)
TSH: 5.74 mIU/L — ABNORMAL HIGH (ref 0.40–4.50)

## 2019-08-24 LAB — HEPATITIS C ANTIBODY
Hepatitis C Ab: NONREACTIVE
SIGNAL TO CUT-OFF: 0.01 (ref <1.00)

## 2019-08-24 LAB — CBC WITH DIFFERENTIAL/PLATELET
Absolute Monocytes: 464 cells/uL (ref 200–950)
Basophils Absolute: 52 cells/uL (ref 0–200)
Basophils Relative: 0.6 %
Eosinophils Absolute: 138 cells/uL (ref 15–500)
Eosinophils Relative: 1.6 %
HCT: 39 % (ref 35.0–45.0)
Hemoglobin: 12.9 g/dL (ref 11.7–15.5)
Lymphs Abs: 3010 cells/uL (ref 850–3900)
MCH: 30.1 pg (ref 27.0–33.0)
MCHC: 33.1 g/dL (ref 32.0–36.0)
MCV: 90.9 fL (ref 80.0–100.0)
MPV: 10.7 fL (ref 7.5–12.5)
Monocytes Relative: 5.4 %
Neutro Abs: 4936 cells/uL (ref 1500–7800)
Neutrophils Relative %: 57.4 %
Platelets: 350 10*3/uL (ref 140–400)
RBC: 4.29 10*6/uL (ref 3.80–5.10)
RDW: 13 % (ref 11.0–15.0)
Total Lymphocyte: 35 %
WBC: 8.6 10*3/uL (ref 3.8–10.8)

## 2019-08-24 LAB — LIPID PANEL
Cholesterol: 238 mg/dL — ABNORMAL HIGH (ref <200)
HDL: 57 mg/dL (ref >=50)
LDL Cholesterol (Calc): 161 mg/dL (calc) — ABNORMAL HIGH
Non-HDL Cholesterol (Calc): 181 mg/dL (calc) — ABNORMAL HIGH (ref <130)
Total CHOL/HDL Ratio: 4.2 (calc) (ref <5.0)
Triglycerides: 95 mg/dL (ref <150)

## 2019-08-24 LAB — HIV ANTIBODY (ROUTINE TESTING W REFLEX): HIV 1&2 Ab, 4th Generation: NONREACTIVE

## 2019-08-24 NOTE — Progress Notes (Signed)
Waiting for previous medical records to review for treatment plan/chronicity of lab results.

## 2019-08-24 NOTE — Progress Notes (Signed)
TSH is high - no medical records to know if this is new or previously diagnosed.  Awaiting medical records and if no previous Dx will send for thyroid ultrasound.  Lipids elevated - awaiting medical records for previous Dx to begin Moderate or High Intensity statin based on comorbidities  Glucose elevated, will repeat an A1C  Awaiting Hep C antibody testing

## 2019-08-25 ENCOUNTER — Other Ambulatory Visit: Payer: Self-pay | Admitting: Family Medicine

## 2019-08-25 DIAGNOSIS — R739 Hyperglycemia, unspecified: Secondary | ICD-10-CM

## 2019-08-25 DIAGNOSIS — E782 Mixed hyperlipidemia: Secondary | ICD-10-CM

## 2019-08-25 DIAGNOSIS — E079 Disorder of thyroid, unspecified: Secondary | ICD-10-CM

## 2019-08-25 NOTE — Progress Notes (Signed)
Patient called back and staff let her know I would call her back.  Attempted to call her back x 2 with no answer.

## 2019-08-25 NOTE — Progress Notes (Signed)
Discussed lab results with Haley Morris.  Agreeable to having thyroid ultrasound completed, working on lifestyle modifications for lipids and having labs repeated in 4 weeks.  (Lipids, Thyroid and A1C).  Will return to clinic in 5 weeks.

## 2019-08-25 NOTE — Progress Notes (Signed)
I called Haley Morris to discuss her lab results and she answered, said hello and after I asked to speak with Haley Morris she hung up...   Some of her labs are abnormal.  I don't have old records to review against.    TSH is elevated which can be indicative of hypothyroidism.   I am going to put in an order for a thyroid ultrasound and we can repeat the thyroid testing in 1 month if she would like before we start on any medications.  Her cholesterol was elevated.  She can work on lifestyle modifications such as diet and exercise or we can begin on a statin for treatment and plan to repeat the labs in 6-8 weeks.  Her glucose was elevated at 118 and will need an A1C for further evaluation. (let me know if she wants POCT and we can set her up for an appt for me to meet with her and discuss all when she gets her POCT or if she wants to do with [facility])  I am going to put the orders in the computer.  Let me know what she says if you can get her on the phone.

## 2019-08-25 NOTE — Progress Notes (Signed)
Discussed lab results with Haley Morris.  Will complete the thyroid ultrasound and have her thyroid function, A1C and repeat lipid profile done in 4 weeks, return to clinic in 5 weeks.  Wants to work on lifestyle modifications for her lipids before thinking about any medications.

## 2019-08-31 ENCOUNTER — Ambulatory Visit
Admission: RE | Admit: 2019-08-31 | Discharge: 2019-08-31 | Disposition: A | Discharge status: Home / Self Care | Payer: PPO | Source: Ambulatory Visit | Attending: Family Medicine | Admitting: Family Medicine

## 2019-08-31 ENCOUNTER — Other Ambulatory Visit: Payer: Self-pay | Admitting: Family Medicine

## 2019-08-31 ENCOUNTER — Other Ambulatory Visit: Payer: Self-pay

## 2019-08-31 DIAGNOSIS — E079 Disorder of thyroid, unspecified: Secondary | ICD-10-CM | POA: Insufficient documentation

## 2019-08-31 DIAGNOSIS — R946 Abnormal results of thyroid function studies: Secondary | ICD-10-CM | POA: Diagnosis not present

## 2019-08-31 NOTE — Progress Notes (Signed)
Normal thyroid ultrasound.  Will repeat the labs in a few weeks as we discussed.  Orders for the thyroid panel were ordered already. Thanks

## 2019-09-13 ENCOUNTER — Ambulatory Visit
Admission: RE | Admit: 2019-09-13 | Discharge: 2019-09-13 | Disposition: A | Discharge status: Home / Self Care | Payer: PPO | Source: Ambulatory Visit | Attending: Family Medicine | Admitting: Family Medicine

## 2019-09-13 ENCOUNTER — Encounter: Payer: Self-pay | Admitting: Family Medicine

## 2019-09-13 DIAGNOSIS — M810 Age-related osteoporosis without current pathological fracture: Secondary | ICD-10-CM | POA: Insufficient documentation

## 2019-09-13 DIAGNOSIS — R928 Other abnormal and inconclusive findings on diagnostic imaging of breast: Secondary | ICD-10-CM | POA: Diagnosis not present

## 2019-09-13 DIAGNOSIS — Z13820 Encounter for screening for osteoporosis: Secondary | ICD-10-CM | POA: Insufficient documentation

## 2019-09-13 DIAGNOSIS — Z780 Asymptomatic menopausal state: Secondary | ICD-10-CM | POA: Diagnosis not present

## 2019-09-13 DIAGNOSIS — Z1231 Encounter for screening mammogram for malignant neoplasm of breast: Secondary | ICD-10-CM | POA: Diagnosis not present

## 2019-09-13 NOTE — Progress Notes (Signed)
Needs additional imaging.  Imaging department will be contacting her to schedule.

## 2019-09-13 NOTE — Progress Notes (Signed)
Bone density shows Osteoporosis.  Can begin an over the counter calcium and vitamin D supplement and would encourage her to do weight bearing exercises 30 minutes per day, every day (such as walking).  We can plan to repeat this bone density in 2 years.  Thanks

## 2019-09-18 ENCOUNTER — Other Ambulatory Visit: Payer: Self-pay | Admitting: Family Medicine

## 2019-09-18 DIAGNOSIS — N6310 Unspecified lump in the right breast, unspecified quadrant: Secondary | ICD-10-CM

## 2019-09-18 DIAGNOSIS — R928 Other abnormal and inconclusive findings on diagnostic imaging of breast: Secondary | ICD-10-CM

## 2019-09-19 ENCOUNTER — Ambulatory Visit: Payer: Self-pay | Admitting: Family Medicine

## 2019-09-25 ENCOUNTER — Other Ambulatory Visit: Payer: PPO

## 2019-09-25 ENCOUNTER — Other Ambulatory Visit: Payer: Self-pay

## 2019-09-25 DIAGNOSIS — R739 Hyperglycemia, unspecified: Secondary | ICD-10-CM

## 2019-09-25 DIAGNOSIS — E079 Disorder of thyroid, unspecified: Secondary | ICD-10-CM | POA: Diagnosis not present

## 2019-09-25 DIAGNOSIS — E782 Mixed hyperlipidemia: Secondary | ICD-10-CM

## 2019-09-26 ENCOUNTER — Encounter: Payer: Self-pay | Admitting: Family Medicine

## 2019-09-26 ENCOUNTER — Ambulatory Visit (INDEPENDENT_AMBULATORY_CARE_PROVIDER_SITE_OTHER): Payer: PPO

## 2019-09-26 VITALS — Ht 60.0 in | Wt 212.0 lb

## 2019-09-26 DIAGNOSIS — R7303 Prediabetes: Secondary | ICD-10-CM | POA: Insufficient documentation

## 2019-09-26 DIAGNOSIS — Z0000 Encounter for general adult medical examination without abnormal findings: Secondary | ICD-10-CM | POA: Diagnosis not present

## 2019-09-26 LAB — THYROID PANEL WITH TSH
Free Thyroxine Index: 2.4 (ref 1.4–3.8)
T3 Uptake: 36 % — ABNORMAL HIGH (ref 22–35)
T4, Total: 6.8 ug/dL (ref 5.1–11.9)
TSH: 4.88 mIU/L — ABNORMAL HIGH (ref 0.40–4.50)

## 2019-09-26 LAB — HEMOGLOBIN A1C
Hgb A1c MFr Bld: 6 % of total Hgb — ABNORMAL HIGH (ref <5.7)
Mean Plasma Glucose: 126 (calc)
eAG (mmol/L): 7 (calc)

## 2019-09-26 LAB — LIPID PANEL
Cholesterol: 246 mg/dL — ABNORMAL HIGH (ref <200)
HDL: 53 mg/dL (ref >=50)
LDL Cholesterol (Calc): 169 mg/dL (calc) — ABNORMAL HIGH
Non-HDL Cholesterol (Calc): 193 mg/dL (calc) — ABNORMAL HIGH (ref <130)
Total CHOL/HDL Ratio: 4.6 (calc) (ref <5.0)
Triglycerides: 112 mg/dL (ref <150)

## 2019-09-26 NOTE — Patient Instructions (Signed)
[name] , Thank you for taking time to come for your Medicare Wellness Visit. I appreciate your ongoing commitment to your health goals. Please review the following plan we discussed and let me know if I can assist you in the future.   Screening recommendations/referrals: Colonoscopy: discussed cologuard  Mammogram: up to date  Bone Density: up to date  Recommended yearly ophthalmology/optometry visit for glaucoma screening and checkup Recommended yearly dental visit for hygiene and checkup  Vaccinations: Influenza vaccine: declined  Pneumococcal vaccine: declined  Tdap vaccine: declined  Shingles vaccine: declined   Covid-19: We are recommending the vaccine to everyone who has not had an allergic reaction to any of the components of the vaccine. If you have specific questions about the vaccine, please bring them up with your health care provider to discuss them.   We will likely not be getting the vaccine in the office for the first rounds of vaccinations. The way they are releasing the vaccines is going to be through the health systems (like [facility], [facility], [facility], [facility]), through your county health department, or through the pharmacies.   The [facility] Department is giving vaccines to those 65+ and Health Care Workers Teachers and [place] providers start 08/09/19, Essential workers start 3/10 and those with co-morbidities start 09/06/19 Call [phone] to schedule  If you are 65+ you can get a vaccine through [facility] by signing up for an appointment.  You can sign up by going to: [email or web address].  You can get more information by going to: [email or web address]  [employer] next door is giving the [employer]- you can call [phone] or stop by there to schedule.   Advanced directives: Advance directive discussed with you today.Once this is complete please bring a copy in to our office so we can scan it into your  chart.  Conditions/risks identified: none   Next appointment: Follow up in one year for your annual wellness visit    Preventive Care 65 Years and Older, Female Preventive care refers to lifestyle choices and visits with your health care provider that can promote health and wellness. What does preventive care include?  A yearly physical exam. This is also called an annual well check.  Dental exams once or twice a year.  Routine eye exams. Ask your health care provider how often you should have your eyes checked.  Personal lifestyle choices, including:  Daily care of your teeth and gums.  Regular physical activity.  Eating a healthy diet.  Avoiding tobacco and drug use.  Limiting alcohol use.  Practicing safe sex.  Taking low-dose aspirin every day.  Taking vitamin and mineral supplements as recommended by your health care provider. What happens during an annual well check? The services and screenings done by your health care provider during your annual well check will depend on your age, overall health, lifestyle risk factors, and family history of disease. Counseling  Your health care provider may ask you questions about your:  Alcohol use.  Tobacco use.  Drug use.  Emotional well-being.  Home and relationship well-being.  Sexual activity.  Eating habits.  History of falls.  Memory and ability to understand (cognition).  Work and work Statistician.  Reproductive health. Screening  You may have the following tests or measurements:  Height, weight, and BMI.  Blood pressure.  Lipid and cholesterol levels. These may be checked every 5 years, or more frequently if you are over 7 years old.  Skin check.  Lung cancer screening. You  may have this screening every year starting at age 58 if you have a 30-pack-year history of smoking and currently smoke or have quit within the past 15 years.  Fecal occult blood test (FOBT) of the stool. You may have this  test every year starting at age 81.  Flexible sigmoidoscopy or colonoscopy. You may have a sigmoidoscopy every 5 years or a colonoscopy every 10 years starting at age 4.  Hepatitis C blood test.  Hepatitis B blood test.  Sexually transmitted disease (STD) testing.  Diabetes screening. This is done by checking your blood sugar (glucose) after you have not eaten for a while (fasting). You may have this done every 1-3 years.  Bone density scan. This is done to screen for osteoporosis. You may have this done starting at age 72.  Mammogram. This may be done every 1-2 years. Talk to your health care provider about how often you should have regular mammograms. Talk with your health care provider about your test results, treatment options, and if necessary, the need for more tests. Vaccines  Your health care provider may recommend certain vaccines, such as:  Influenza vaccine. This is recommended every year.  Tetanus, diphtheria, and acellular pertussis (Tdap, Td) vaccine. You may need a Td booster every 10 years.  Zoster vaccine. You may need this after age 37.  Pneumococcal 13-valent conjugate (PCV13) vaccine. One dose is recommended after age 81.  Pneumococcal polysaccharide (PPSV23) vaccine. One dose is recommended after age 96. Talk to your health care provider about which screenings and vaccines you need and how often you need them. This information is not intended to replace advice given to you by your health care provider. Make sure you discuss any questions you have with your health care provider. Document Released: 06/28/2015 Document Revised: 02/19/2016 Document Reviewed: 04/02/2015 Elsevier Interactive Patient Education  2017 [employer] Prevention in the Home Falls can cause injuries. They can happen to people of all ages. There are many things you can do to make your home safe and to help prevent falls. What can I do on the outside of my home?  Regularly fix the  edges of walkways and driveways and fix any cracks.  Remove anything that might make you trip as you walk through a door, such as a raised step or threshold.  Trim any bushes or trees on the path to your home.  Use bright outdoor lighting.  Clear any walking paths of anything that might make someone trip, such as rocks or tools.  Regularly check to see if handrails are loose or broken. Make sure that both sides of any steps have handrails.  Any raised decks and porches should have guardrails on the edges.  Have any leaves, snow, or ice cleared regularly.  Use sand or salt on walking paths during winter.  Clean up any spills in your garage right away. This includes oil or grease spills. What can I do in the bathroom?  Use night lights.  Install grab bars by the toilet and in the tub and shower. Do not use towel bars as grab bars.  Use non-skid mats or decals in the tub or shower.  If you need to sit down in the shower, use a plastic, non-slip stool.  Keep the floor dry. Clean up any water that spills on the floor as soon as it happens.  Remove soap buildup in the tub or shower regularly.  Attach bath mats securely with double-sided non-slip rug tape.  Do  not have throw rugs and other things on the floor that can make you trip. What can I do in the bedroom?  Use night lights.  Make sure that you have a light by your bed that is easy to reach.  Do not use any sheets or blankets that are too big for your bed. They should not hang down onto the floor.  Have a firm chair that has side arms. You can use this for support while you get dressed.  Do not have throw rugs and other things on the floor that can make you trip. What can I do in the kitchen?  Clean up any spills right away.  Avoid walking on wet floors.  Keep items that you use a lot in easy-to-reach places.  If you need to reach something above you, use a strong step stool that has a grab bar.  Keep  electrical cords out of the way.  Do not use floor polish or wax that makes floors slippery. If you must use wax, use non-skid floor wax.  Do not have throw rugs and other things on the floor that can make you trip. What can I do with my stairs?  Do not leave any items on the stairs.  Make sure that there are handrails on both sides of the stairs and use them. Fix handrails that are broken or loose. Make sure that handrails are as long as the stairways.  Check any carpeting to make sure that it is firmly attached to the stairs. Fix any carpet that is loose or worn.  Avoid having throw rugs at the top or bottom of the stairs. If you do have throw rugs, attach them to the floor with carpet tape.  Make sure that you have a light switch at the top of the stairs and the bottom of the stairs. If you do not have them, ask someone to add them for you. What else can I do to help prevent falls?  Wear shoes that:  Do not have high heels.  Have rubber bottoms.  Are comfortable and fit you well.  Are closed at the toe. Do not wear sandals.  If you use a stepladder:  Make sure that it is fully opened. Do not climb a closed stepladder.  Make sure that both sides of the stepladder are locked into place.  Ask someone to hold it for you, if possible.  Clearly mark and make sure that you can see:  Any grab bars or handrails.  First and last steps.  Where the edge of each step is.  Use tools that help you move around (mobility aids) if they are needed. These include:  Canes.  Walkers.  Scooters.  Crutches.  Turn on the lights when you go into a dark area. Replace any light bulbs as soon as they burn out.  Set up your furniture so you have a clear path. Avoid moving your furniture around.  If any of your floors are uneven, fix them.  If there are any pets around you, be aware of where they are.  Review your medicines with your doctor. Some medicines can make you feel dizzy.  This can increase your chance of falling. Ask your doctor what other things that you can do to help prevent falls. This information is not intended to replace advice given to you by your health care provider. Make sure you discuss any questions you have with your health care provider. Document Released: 03/28/2009 Document Revised: 11/07/2015  Document Reviewed: 07/06/2014 Elsevier Interactive Patient Education  2017 [employer].

## 2019-09-26 NOTE — Progress Notes (Signed)
Subjective:   Haley Morris is a 69 y.o. female who presents for an Initial Medicare Annual Wellness Visit.  This visit is being conducted via phone call  - after an attmept to do on video chat - due to the COVID-19 pandemic. This patient has given me verbal consent via phone to conduct this visit, patient states they are participating from their home address. Some vital signs may be absent or patient reported.   Patient identification: identified by name, DOB, and current address.    Review of Systems      Cardiac Risk Factors include: advanced age (>63men, >53 women)     Objective:    Today's Vitals   09/26/19 0946  Weight: 212 lb (96.2 kg)  Height: 5' (1.524 m)   Body mass index is 41.4 kg/m.  Advanced Directives 09/26/2019 12/05/2018 11/08/2018  Does Patient Have a Medical Advance Directive? No No Yes  Type of Advance Directive - Public librarian;Living will  Does patient want to make changes to medical advance directive? - - No - Guardian declined  Copy of Teller in Chart? - - Yes - validated most recent copy scanned in chart (See row information)  Would patient like information on creating a medical advance directive? - No - Patient declined No - Patient declined    Current Medications (verified) Outpatient Encounter Medications as of 09/26/2019  Medication Sig  . acetaminophen (TYLENOL) 500 MG tablet Take 500 mg by mouth every 6 (six) hours as needed.  . Ascorbic Acid (VITAMIN C PO) Take by mouth daily.  . B Complex Vitamins (VITAMIN B COMPLEX PO) Take by mouth daily.  Marland Kitchen CINNAMON PO Take by mouth daily.  Marland Kitchen ELDERBERRY PO Take by mouth.  . Ginger, Zingiber officinalis, (GINGER ROOT) 550 MG CAPS Take by mouth.  . Multiple Vitamin (MULTIVITAMIN) tablet Take 1 tablet by mouth daily.  . TURMERIC PO Take by mouth daily.   No facility-administered encounter medications on file as of 09/26/2019.    Allergies (verified) Cefdinir,  Erythromycin, and Hydromet [hydrocodone-homatropine]   History: Past Medical History:  Diagnosis Date  . Arthritis    knees   Past Surgical History:  Procedure Laterality Date  . ABDOMINAL HYSTERECTOMY    . CATARACT EXTRACTION W/PHACO Left 11/08/2018   Procedure: CATARACT EXTRACTION PHACO AND INTRAOCULAR LENS PLACEMENT ([place]) LEFT;  Surgeon: Eulogio Bear, MD;  Location: [facility];  Service: Ophthalmology;  Laterality: Left;  . CATARACT EXTRACTION W/PHACO Right 12/05/2018   Procedure: CATARACT EXTRACTION PHACO AND INTRAOCULAR LENS PLACEMENT ([place])  RIGHT;  Surgeon: Eulogio Bear, MD;  Location: [facility];  Service: Ophthalmology;  Laterality: Right;  . CHOLECYSTECTOMY    . COLONOSCOPY    . LESION EXCISION     nose  . MASS EXCISION Left    thumb  . PARTIAL HYSTERECTOMY     Family History  Problem Relation Age of Onset  . Heart attack Father   . Lung cancer Brother    Social History   Socioeconomic History  . Marital status: Widowed    Spouse name: Not on file  . Number of children: Not on file  . Years of education: Not on file  . Highest education level: Not on file  Occupational History  . Occupation: retired   Tobacco Use  . Smoking status: Former Research scientist (life sciences)  . Smokeless tobacco: Never Used  . Tobacco comment: socially in her 49s  Substance and Sexual Activity  .  Alcohol use: Not Currently  . Drug use: Not Currently  . Sexual activity: Not Currently  Other Topics Concern  . Not on file  Social History Narrative  . Not on file   Social Determinants of Health   Financial Resource Strain: Low Risk   . Difficulty of Paying Living Expenses: Not hard at all  Food Insecurity: No Food Insecurity  . Worried About Charity fundraiser in the Last Year: Never true  . Ran Out of Food in the Last Year: Never true  Transportation Needs: No Transportation Needs  . Lack of Transportation (Medical): No  . Lack of Transportation (Non-Medical): No    Physical Activity:   . Days of Exercise per Week:   . Minutes of Exercise per Session:   Stress:   . Feeling of Stress :   Social Connections: Somewhat Isolated  . Frequency of Communication with Friends and Family: More than three times a week  . Frequency of Social Gatherings with Friends and Family: More than three times a week  . Attends Religious Services: More than 4 times per year  . Active Member of Clubs or Organizations: No  . Attends Archivist Meetings: Never  . Marital Status: Widowed    Tobacco Counseling Counseling given: Not Answered Comment: socially in her 16s   Clinical Intake:  Pre-visit preparation completed: Yes  Pain : No/denies pain     Nutritional Status: BMI > 30  Obese Nutritional Risks: None Diabetes: No  How often do you need to have someone help you when you read instructions, pamphlets, or other written materials from your doctor or pharmacy?: 1 - Never  Interpreter Needed?: No  Information entered by :: Haley Alcala,LPN   Activities of Daily Living In your present state of health, do you have any difficulty performing the following activities: 09/26/2019 12/05/2018  Hearing? N N  Comment no hearing aids -  Vision? N N  Comment reading glasses, Dr.King annually -  Difficulty concentrating or making decisions? N N  Walking or climbing stairs? N N  Dressing or bathing? N N  Doing errands, shopping? N -  Preparing Food and eating ? N -  Using the Toilet? N -  In the past six months, have you accidently leaked urine? N -  Do you have problems with loss of bowel control? N -  Managing your Medications? N -  Managing your Finances? N -  Housekeeping or managing your Housekeeping? N -  Some recent data might be hidden     Immunizations and Health Maintenance  There is no immunization history on file for this patient. Health Maintenance Due  Topic Date Due  . COLONOSCOPY  Never done    Patient Care Team: Verl Bangs, FNP as PCP - General (Family Medicine)  Indicate any recent Medical Services you may have received from other than [facility] providers in the past year (date may be approximate).     Assessment:   This is a routine wellness examination for Haley Morris.  Hearing/Vision screen  Hearing Screening   125Hz  250Hz  500Hz  1000Hz  2000Hz  3000Hz  4000Hz  6000Hz  8000Hz   Right ear:           Left ear:           Vision Screening Comments: Dr.King annually   Dietary issues and exercise activities discussed: Current Exercise Habits: Home exercise routine, Type of exercise: walking, Time (Minutes): 30, Frequency (Times/Week): 7, Weekly Exercise (Minutes/Week): 210, Intensity: Mild, Exercise limited by: None  identified  Goals Addressed   None    Depression Screen PHQ 2/9 Scores 09/26/2019 08/22/2019  PHQ - 2 Score 0 0    Fall Risk Fall Risk  09/26/2019  Falls in the past year? 0  Number falls in past yr: 0  Injury with Fall? 0   FALL RISK PREVENTION PERTAINING TO THE HOME:  Any stairs in or around the home? No  If so, are there any without handrails? No   Home free of loose throw rugs in walkways, pet beds, electrical cords, etc? Yes  Adequate lighting in your home to reduce risk of falls? Yes   ASSISTIVE DEVICES UTILIZED TO PREVENT FALLS:  Life alert? No  Use of a cane, walker or w/c? No  Grab bars in the bathroom? No  Shower chair or bench in shower? No  Elevated toilet seat or a handicapped toilet? No    DME ORDERS:  DME order needed?  No   TIMED UP AND GO:  Unable to perform     Cognitive Function:        Screening Tests Health Maintenance  Topic Date Due  . COLONOSCOPY  Never done  . TETANUS/TDAP  09/25/2020 (Originally 03/01/1970)  . PNA vac Low Risk Adult (1 of 2 - PCV13) 09/25/2020 (Originally 03/01/2016)  . INFLUENZA VACCINE  01/14/2020  . MAMMOGRAM  09/12/2021  . DEXA SCAN  Completed  . Hepatitis C Screening  Completed    Qualifies for Shingles Vaccine? Yes   Zostavax completed n/a. Due for Shingrix. Education has been provided regarding the importance of this vaccine. Pt has been advised to call insurance company to determine out of pocket expense. Advised may also receive vaccine at local pharmacy or Health Dept. Verbalized acceptance and understanding.  Tdap: declined  Flu Vaccine: declined   Pneumococcal Vaccine: declined   Covid-19 Vaccine: information provided   Cancer Screenings:  Colorectal Screening: discuss cologuard and colonoscopy.   Mammogram: Completed 09/13/2019, has repeat for mammogram coming up. Repeat every year  Bone Density: Completed 09/13/2019  Lung Cancer Screening: (Low Dose CT Chest recommended if Age 61-80 years, 30 pack-year currently smoking OR have quit w/in 15years.) does not qualify.    Additional Screening:  Hepatitis C Screening: does qualify; Completed 2021  Vision Screening: Recommended annual ophthalmology exams for early detection of glaucoma and other disorders of the eye. Is the patient up to date with their annual eye exam?  Yes  Who is the provider or what is the name of the office in which the pt attends annual eye exams? Dr.King    Dental Screening: Recommended annual dental exams for proper oral hygiene    Community Resource Referral:  CRR required this visit?  No       Plan:  I have personally reviewed and addressed the Medicare Annual Wellness questionnaire and have noted the following in the patient's chart:  A. Medical and social history B. Use of alcohol, tobacco or illicit drugs  C. Current medications and supplements D. Functional ability and status E.  Nutritional status F.  Physical activity G. Advance directives H. List of other physicians I.  Hospitalizations, surgeries, and ER visits in previous 12 months J.  [place] such as hearing and vision if needed, cognitive and depression L. Referrals and appointments   In addition, I have reviewed and discussed  with patient certain preventive protocols, quality metrics, and best practice recommendations. A written personalized care plan for preventive services as well as general preventive health  recommendations were provided to patient.   Signed,    Bevelyn Ngo, LPN   [id number]  Nurse Health Advisor   Nurse Notes: none

## 2019-09-28 ENCOUNTER — Ambulatory Visit
Admission: RE | Admit: 2019-09-28 | Discharge: 2019-09-28 | Disposition: A | Discharge status: Home / Self Care | Payer: PPO | Source: Ambulatory Visit | Attending: Family Medicine | Admitting: Family Medicine

## 2019-09-28 ENCOUNTER — Other Ambulatory Visit: Payer: Self-pay | Admitting: Family Medicine

## 2019-09-28 DIAGNOSIS — N6310 Unspecified lump in the right breast, unspecified quadrant: Secondary | ICD-10-CM

## 2019-09-28 DIAGNOSIS — R928 Other abnormal and inconclusive findings on diagnostic imaging of breast: Secondary | ICD-10-CM

## 2019-09-28 DIAGNOSIS — N6312 Unspecified lump in the right breast, upper inner quadrant: Secondary | ICD-10-CM | POA: Diagnosis not present

## 2019-09-28 NOTE — Progress Notes (Signed)
Awaiting biopsy results

## 2019-09-29 ENCOUNTER — Encounter: Payer: Self-pay | Admitting: Family Medicine

## 2019-09-29 ENCOUNTER — Ambulatory Visit (INDEPENDENT_AMBULATORY_CARE_PROVIDER_SITE_OTHER): Payer: PPO | Admitting: Family Medicine

## 2019-09-29 ENCOUNTER — Other Ambulatory Visit: Payer: Self-pay

## 2019-09-29 VITALS — BP 165/77 | HR 89 | Temp 96.9°F | Ht 60.0 in | Wt 219.4 lb

## 2019-09-29 DIAGNOSIS — Z1211 Encounter for screening for malignant neoplasm of colon: Secondary | ICD-10-CM

## 2019-09-29 DIAGNOSIS — R030 Elevated blood-pressure reading, without diagnosis of hypertension: Secondary | ICD-10-CM

## 2019-09-29 DIAGNOSIS — E785 Hyperlipidemia, unspecified: Secondary | ICD-10-CM

## 2019-09-29 DIAGNOSIS — I10 Essential (primary) hypertension: Secondary | ICD-10-CM | POA: Insufficient documentation

## 2019-09-29 LAB — POCT URINALYSIS DIPSTICK
Bilirubin, UA: NEGATIVE
Blood, UA: NEGATIVE
Glucose, UA: NEGATIVE
Ketones, UA: NEGATIVE
Leukocytes, UA: NEGATIVE
Nitrite, UA: NEGATIVE
Protein, UA: NEGATIVE
Spec Grav, UA: 1.02 (ref 1.010–1.025)
Urobilinogen, UA: 0.2 E.U./dL
pH, UA: 5 (ref 5.0–8.0)

## 2019-09-29 NOTE — Progress Notes (Signed)
Subjective:    Patient ID: Haley Morris, female    DOB: 12-25-1950, 69 y.o.   MRN: [id number]  Haley Morris is a 69 y.o. female presenting on 09/29/2019 for Hypertension   HPI   Haley Morris presents to clinic for a follow up on her elevated blood pressure from her last visit.  Denies headaches, visual changes, dizziness, lightheadedness, SOB, CP, abdominal pain, n/v/d.  States is beginning a new diet/exercise routine and would like to try that for 1 month before starting on blood pressure medications.   Depression screen [facility] 2/9 09/26/2019 08/22/2019  Decreased Interest 0 0  Down, Depressed, Hopeless 0 0  PHQ - 2 Score 0 0    Social History   Tobacco Use  . Smoking status: Former Research scientist (life sciences)  . Smokeless tobacco: Never Used  . Tobacco comment: socially in her 12s  Substance Use Topics  . Alcohol use: Not Currently  . Drug use: Not Currently    Review of Systems  Constitutional: Negative.   HENT: Negative.   Eyes: Negative.   Respiratory: Negative.   Cardiovascular: Negative.   Gastrointestinal: Negative.   Endocrine: Negative.   Genitourinary: Negative.   Musculoskeletal: Negative.   Skin: Negative.   Allergic/Immunologic: Negative.   Neurological: Negative.   Hematological: Negative.   Psychiatric/Behavioral: Negative.    Per HPI unless specifically indicated above     Objective:    BP (!) 165/77 (BP Location: Left Arm, Patient Position: Sitting, Cuff Size: Large)   Pulse 89   Temp (!) 96.9 F (36.1 C) (Temporal)   Ht 5' (1.524 m)   Wt 219 lb 6.4 oz (99.5 kg)   SpO2 100%   BMI 42.85 kg/m   Wt Readings from Last 3 Encounters:  09/29/19 219 lb 6.4 oz (99.5 kg)  09/26/19 212 lb (96.2 kg)  08/22/19 214 lb 12.8 oz (97.4 kg)    Physical Exam Vitals reviewed.  Constitutional:      General: She is not in acute distress.    Appearance: Normal appearance. She is well-developed and well-groomed. She is obese. She is not ill-appearing or toxic-appearing.  HENT:       Head: Normocephalic.  Eyes:     General: Lids are normal. Vision grossly intact.        Right eye: No discharge.        Left eye: No discharge.     Extraocular Movements: Extraocular movements intact.     Conjunctiva/sclera: Conjunctivae normal.     Pupils: Pupils are equal, round, and reactive to light.  Cardiovascular:     Rate and Rhythm: Normal rate and regular rhythm.     Pulses: Normal pulses.          Dorsalis pedis pulses are 2+ on the right side and 2+ on the left side.       Posterior tibial pulses are 2+ on the right side and 2+ on the left side.     Heart sounds: Normal heart sounds. No murmur. No friction rub. No gallop.   Pulmonary:     Effort: Pulmonary effort is normal. No respiratory distress.     Breath sounds: Normal breath sounds.  Musculoskeletal:     Right lower leg: No edema.     Left lower leg: No edema.  Feet:     Right foot:     Skin integrity: Skin integrity normal.     Left foot:     Skin integrity: Skin integrity normal.  Skin:  General: Skin is warm and dry.     Capillary Refill: Capillary refill takes less than 2 seconds.  Neurological:     General: No focal deficit present.     Mental Status: She is alert and oriented to person, place, and time.     Cranial Nerves: No cranial nerve deficit.     Sensory: No sensory deficit.     Motor: No weakness.     Coordination: Coordination normal.     Gait: Gait normal.  Psychiatric:        Attention and Perception: Attention and perception normal.        Mood and Affect: Mood and affect normal.        Speech: Speech normal.        Behavior: Behavior normal. Behavior is cooperative.        Thought Content: Thought content normal.        Cognition and Memory: Cognition and memory normal.        Judgment: Judgment normal.     Results for orders placed or performed in visit on 09/29/19  POCT Urinalysis Dipstick  Result Value Ref Range   Color, UA Yellow    Clarity, UA Clear    Glucose, UA  Negative Negative   Bilirubin, UA negative    Ketones, UA negative    Spec Grav, UA 1.020 1.010 - 1.025   Blood, UA negative    pH, UA 5.0 5.0 - 8.0   Protein, UA Negative Negative   Urobilinogen, UA 0.2 0.2 or 1.0 E.U./dL   Nitrite, UA negative    Leukocytes, UA Negative Negative   Appearance     Odor        Assessment & Plan:   Problem List Items Addressed This Visit      Cardiovascular and Mediastinum   Hypertension    Uncontrolled hypertension.  BP is not at goal < 130/80.  Pt is beginning to work on lifestyle modifications.    Plan: 1. Work on lifestyle modifications with diet and exercise this month 2. Encouraged heart healthy diet and increasing exercise to 30 minutes most days of the week, going no more than 2 days in a row without exercise. 3. Check BP 1-2 x per day at home, keep log, and bring to clinic at next appointment. 4. Follow up 1 month, discussed if continued to have elevations in blood pressure will need to start medication to lower BP.  Patient in agreement with plan.          Other   Hyperlipidemia    Discussed elevated lipid profile labs.  Patient requesting to work on diet and exercise over the next 3 months before repeating lipid labs before she goes on medication for this.  In agreement with plan.  Plan: 1. Begin working on lifestyle modifications 2. Follow up in 3 months for repeat labs and discussion if need cholesterol lowering medications       Other Visit Diagnoses    Elevated blood-pressure reading without diagnosis of hypertension    -  Primary   Relevant Orders   POCT Urinalysis Dipstick (Completed)   Screening for colon cancer       Relevant Orders   Cologuard      No orders of the defined types were placed in this encounter.     Follow up plan: Return in about 4 weeks (around 10/27/2019) for HTN F/U.   Harlin Rain, [place] [facility] Nurse Practitioner [facility] 09/29/2019,  10:37 AM

## 2019-09-29 NOTE — Progress Notes (Signed)
Reviewed at 09/29/2019 appointment.  Awaiting appt for biopsy.

## 2019-09-29 NOTE — Patient Instructions (Addendum)
We can wait another 1 month to recheck your blood pressure and recheck your cholesterol in 3 months  Try to get exercise a minimum of 30 minutes per day at least 5 days per week as well as  adequate water intake all while measuring blood pressure a few times per week.  Keep a blood pressure log and bring back to clinic at your next visit.  If your readings are consistently over 140/90 to contact our office/send me a MyChart message and we will see you sooner.  Can try DASH and Mediterranean diet options, avoiding processed foods, lowering sodium intake, avoiding pork products, and eating a plant based diet for optimal health.  Education and discussion with patient regarding hypertension as well as the effects on the organs and body.  Specifically, we spoke about kidney disease, kidney failure, heart attack, stroke and up to and including death, as likely outcomes if non-compliant with blood pressure regulation.  Discussed how all of these habits are attached to each other and each has the effect on each other.      Mediterranean Diet  Why follow it? Research shows. . Those who follow the Mediterranean diet have a reduced risk of heart disease  . The diet is associated with a reduced incidence of Parkinson's and Alzheimer's diseases . People following the diet may have longer life expectancies and lower rates of chronic diseases  . The Dietary Guidelines for Americans recommends the Mediterranean diet as an eating plan to promote health and prevent disease  What Is the Mediterranean Diet?  . Healthy eating plan based on typical foods and recipes of Mediterranean-style cooking . The diet is primarily a plant based diet; these foods should make up a majority of meals   Starches - Plant based foods should make up a majority of meals - They are an important sources of vitamins, minerals, energy, antioxidants, and fiber - Choose whole grains, foods high in fiber and minimally processed items  -  Typical grain sources include wheat, oats, barley, corn, brown rice, bulgar, farro, millet, polenta, couscous  - Various types of beans include chickpeas, lentils, fava beans, black beans, white beans   Fruits  Veggies - Large quantities of antioxidant rich fruits & veggies; 6 or more servings  - Vegetables can be eaten raw or lightly drizzled with oil and cooked  - Vegetables common to the traditional Mediterranean Diet include: artichokes, arugula, beets, broccoli, brussel sprouts, cabbage, carrots, celery, collard greens, cucumbers, eggplant, kale, leeks, lemons, lettuce, mushrooms, okra, onions, peas, peppers, potatoes, pumpkin, radishes, rutabaga, shallots, spinach, sweet potatoes, turnips, zucchini - Fruits common to the Mediterranean Diet include: apples, apricots, avocados, cherries, clementines, dates, figs, grapefruits, grapes, melons, nectarines, oranges, peaches, pears, pomegranates, strawberries, tangerines  Fats - Replace butter and margarine with healthy oils, such as olive oil, canola oil, and tahini  - Limit nuts to no more than a handful a day  - Nuts include walnuts, almonds, pecans, pistachios, pine nuts  - Limit or avoid candied, honey roasted or heavily salted nuts - Olives are central to the [employer] - can be eaten whole or used in a variety of dishes   Meats Protein - Limiting red meat: no more than a few times a month - When eating red meat: choose lean cuts and keep the portion to the size of deck of cards - Eggs: approx. 0 to 4 times a week  - Fish and lean poultry: at least 2 a week  - Healthy protein  sources include, chicken, [place], lean beef, lamb - Increase intake of seafood such as tuna, salmon, trout, mackerel, shrimp, scallops - Avoid or limit high fat processed meats such as sausage and bacon  Dairy - Include moderate amounts of low fat dairy products  - Focus on healthy dairy such as fat free yogurt, skim milk, low or reduced fat cheese - Limit dairy  products higher in fat such as whole or 2% milk, cheese, ice cream  Alcohol - Moderate amounts of red wine is ok  - No more than 5 oz daily for women (all ages) and men older than age 57  - No more than 10 oz of wine daily for men younger than 61  Other - Limit sweets and other desserts  - Use herbs and spices instead of salt to flavor foods  - Herbs and spices common to the traditional Mediterranean Diet include: basil, bay leaves, chives, cloves, cumin, fennel, garlic, lavender, marjoram, mint, oregano, parsley, pepper, rosemary, sage, savory, sumac, tarragon, thyme   It's not just a diet, it's a lifestyle:  . The Mediterranean diet includes lifestyle factors typical of those in the region  . Foods, drinks and meals are best eaten with others and savored . Daily physical activity is important for overall good health . This could be strenuous exercise like running and aerobics . This could also be more leisurely activities such as walking, housework, yard-work, or taking the stairs . Moderation is the key; a balanced and healthy diet accommodates most foods and drinks . Consider portion sizes and frequency of consumption of certain foods   Meal Ideas & Options:  . Breakfast:  o Whole wheat toast or whole wheat English muffins with peanut butter & hard boiled egg o Steel cut oats topped with apples & cinnamon and skim milk  o Fresh fruit: banana, strawberries, melon, berries, peaches  o Smoothies: strawberries, bananas, greek yogurt, peanut butter o Low fat greek yogurt with blueberries and granola  o Egg white omelet with spinach and mushrooms o Breakfast couscous: whole wheat couscous, apricots, skim milk, cranberries  . Sandwiches:  o Hummus and grilled vegetables (peppers, zucchini, squash) on whole wheat bread   o Grilled chicken on whole wheat pita with lettuce, tomatoes, cucumbers or tzatziki  o Tuna salad on whole wheat bread: tuna salad made with greek yogurt, olives, red  peppers, capers, green onions o Garlic rosemary lamb pita: lamb sauted with garlic, rosemary, salt & pepper; add lettuce, cucumber, greek yogurt to pita - flavor with lemon juice and black pepper  . Seafood:  o Mediterranean grilled salmon, seasoned with garlic, basil, parsley, lemon juice and black pepper o Shrimp, lemon, and spinach whole-grain pasta salad made with low fat greek yogurt  o Seared scallops with lemon orzo  o Seared tuna steaks seasoned salt, pepper, coriander topped with tomato mixture of olives, tomatoes, olive oil, minced garlic, parsley, green onions and cappers  . Meats:  o Herbed greek chicken salad with kalamata olives, cucumber, feta  o Red bell peppers stuffed with spinach, bulgur, lean ground beef (or lentils) & topped with feta   o Kebabs: skewers of chicken, tomatoes, onions, zucchini, squash  o [place] burgers: made with red onions, mint, dill, lemon juice, feta cheese topped with roasted red peppers . Vegetarian o Cucumber salad: cucumbers, artichoke hearts, celery, red onion, feta cheese, tossed in olive oil & lemon juice  o Hummus and whole grain pita points with a greek salad (lettuce, tomato, feta, olives,  cucumbers, red onion) o Lentil soup with celery, carrots made with vegetable broth, garlic, salt and pepper  o Tabouli salad: parsley, bulgur, mint, scallions, cucumbers, tomato, radishes, lemon juice, olive oil, salt and pepper.  We will plan to see you back in 2 weeks for hypertension follow up  You will receive a survey after today's visit either digitally by e-mail or paper by [place] mail. Your experiences and feedback matter to [place].  Please respond so we know how we are doing as we provide care for you.  Call us with any questions/concerns/needs.  It is my goal to be available to you for your health concerns.  Thanks for choosing me to be a partner in your healthcare needs!  Harlin Rain, FNP-C Family Nurse Practitioner [facility] Phone: [phone]

## 2019-09-29 NOTE — Assessment & Plan Note (Signed)
Discussed elevated lipid profile labs.  Patient requesting to work on diet and exercise over the next 3 months before repeating lipid labs before she goes on medication for this.  In agreement with plan.  Plan: 1. Begin working on lifestyle modifications 2. Follow up in 3 months for repeat labs and discussion if need cholesterol lowering medications

## 2019-09-29 NOTE — Progress Notes (Signed)
Reviewed at 09/29/2019 appointment

## 2019-09-29 NOTE — Assessment & Plan Note (Signed)
Uncontrolled hypertension.  BP is not at goal < 130/80.  Pt is beginning to work on lifestyle modifications.    Plan: 1. Work on lifestyle modifications with diet and exercise this month 2. Encouraged heart healthy diet and increasing exercise to 30 minutes most days of the week, going no more than 2 days in a row without exercise. 3. Check BP 1-2 x per day at home, keep log, and bring to clinic at next appointment. 4. Follow up 1 month, discussed if continued to have elevations in blood pressure will need to start medication to lower BP.  Patient in agreement with plan.

## 2019-10-05 ENCOUNTER — Ambulatory Visit
Admission: RE | Admit: 2019-10-05 | Discharge: 2019-10-05 | Disposition: A | Discharge status: Home / Self Care | Payer: PPO | Source: Ambulatory Visit | Attending: Family Medicine | Admitting: Family Medicine

## 2019-10-05 DIAGNOSIS — N6310 Unspecified lump in the right breast, unspecified quadrant: Secondary | ICD-10-CM

## 2019-10-05 DIAGNOSIS — R928 Other abnormal and inconclusive findings on diagnostic imaging of breast: Secondary | ICD-10-CM

## 2019-10-05 DIAGNOSIS — N6312 Unspecified lump in the right breast, upper inner quadrant: Secondary | ICD-10-CM | POA: Diagnosis not present

## 2019-10-05 DIAGNOSIS — Z7689 Persons encountering health services in other specified circumstances: Secondary | ICD-10-CM | POA: Diagnosis not present

## 2019-10-05 HISTORY — PX: BREAST BIOPSY: SHX20

## 2019-10-06 LAB — SURGICAL PATHOLOGY

## 2019-10-09 DIAGNOSIS — Z1211 Encounter for screening for malignant neoplasm of colon: Secondary | ICD-10-CM | POA: Diagnosis not present

## 2019-10-13 LAB — COLOGUARD
COLOGUARD: NEGATIVE
Cologuard: NEGATIVE

## 2019-10-20 ENCOUNTER — Ambulatory Visit (INDEPENDENT_AMBULATORY_CARE_PROVIDER_SITE_OTHER): Payer: PPO | Admitting: Family Medicine

## 2019-10-20 ENCOUNTER — Other Ambulatory Visit: Payer: Self-pay

## 2019-10-20 ENCOUNTER — Encounter: Payer: Self-pay | Admitting: Family Medicine

## 2019-10-20 DIAGNOSIS — R030 Elevated blood-pressure reading, without diagnosis of hypertension: Secondary | ICD-10-CM | POA: Diagnosis not present

## 2019-10-20 NOTE — Patient Instructions (Signed)
Mediterranean Diet  Why follow it? Research shows. . Those who follow the Mediterranean diet have a reduced risk of heart disease  . The diet is associated with a reduced incidence of Parkinson's and Alzheimer's diseases . People following the diet may have longer life expectancies and lower rates of chronic diseases  . The Dietary Guidelines for Americans recommends the Mediterranean diet as an eating plan to promote health and prevent disease  What Is the Mediterranean Diet?  . Healthy eating plan based on typical foods and recipes of Mediterranean-style cooking . The diet is primarily a plant based diet; these foods should make up a majority of meals   Starches - Plant based foods should make up a majority of meals - They are an important sources of vitamins, minerals, energy, antioxidants, and fiber - Choose whole grains, foods high in fiber and minimally processed items  - Typical grain sources include wheat, oats, barley, corn, brown rice, bulgar, farro, millet, polenta, couscous  - Various types of beans include chickpeas, lentils, fava beans, black beans, white beans   Fruits  Veggies - Large quantities of antioxidant rich fruits & veggies; 6 or more servings  - Vegetables can be eaten raw or lightly drizzled with oil and cooked  - Vegetables common to the traditional Mediterranean Diet include: artichokes, arugula, beets, broccoli, brussel sprouts, cabbage, carrots, celery, collard greens, cucumbers, eggplant, kale, leeks, lemons, lettuce, mushrooms, okra, onions, peas, peppers, potatoes, pumpkin, radishes, rutabaga, shallots, spinach, sweet potatoes, turnips, zucchini - Fruits common to the Mediterranean Diet include: apples, apricots, avocados, cherries, clementines, dates, figs, grapefruits, grapes, melons, nectarines, oranges, peaches, pears, pomegranates, strawberries, tangerines  Fats - Replace butter and margarine with healthy oils, such as olive oil, canola oil, and tahini   - Limit nuts to no more than a handful a day  - Nuts include walnuts, almonds, pecans, pistachios, pine nuts  - Limit or avoid candied, honey roasted or heavily salted nuts - Olives are central to the [employer] - can be eaten whole or used in a variety of dishes   Meats Protein - Limiting red meat: no more than a few times a month - When eating red meat: choose lean cuts and keep the portion to the size of deck of cards - Eggs: approx. 0 to 4 times a week  - Fish and lean poultry: at least 2 a week  - Healthy protein sources include, chicken, [place], lean beef, lamb - Increase intake of seafood such as tuna, salmon, trout, mackerel, shrimp, scallops - Avoid or limit high fat processed meats such as sausage and bacon  Dairy - Include moderate amounts of low fat dairy products  - Focus on healthy dairy such as fat free yogurt, skim milk, low or reduced fat cheese - Limit dairy products higher in fat such as whole or 2% milk, cheese, ice cream  Alcohol - Moderate amounts of red wine is ok  - No more than 5 oz daily for women (all ages) and men older than age 81  - No more than 10 oz of wine daily for men younger than 34  Other - Limit sweets and other desserts  - Use herbs and spices instead of salt to flavor foods  - Herbs and spices common to the traditional Mediterranean Diet include: basil, bay leaves, chives, cloves, cumin, fennel, garlic, lavender, marjoram, mint, oregano, parsley, pepper, rosemary, sage, savory, sumac, tarragon, thyme   It's not just a diet, it's a lifestyle:  . The  Mediterranean diet includes lifestyle factors typical of those in the region  . Foods, drinks and meals are best eaten with others and savored . Daily physical activity is important for overall good health . This could be strenuous exercise like running and aerobics . This could also be more leisurely activities such as walking, housework, yard-work, or taking the stairs . Moderation is the key;  a balanced and healthy diet accommodates most foods and drinks . Consider portion sizes and frequency of consumption of certain foods   Meal Ideas & Options:  . Breakfast:  o Whole wheat toast or whole wheat English muffins with peanut butter & hard boiled egg o Steel cut oats topped with apples & cinnamon and skim milk  o Fresh fruit: banana, strawberries, melon, berries, peaches  o Smoothies: strawberries, bananas, greek yogurt, peanut butter o Low fat greek yogurt with blueberries and granola  o Egg white omelet with spinach and mushrooms o Breakfast couscous: whole wheat couscous, apricots, skim milk, cranberries  . Sandwiches:  o Hummus and grilled vegetables (peppers, zucchini, squash) on whole wheat bread   o Grilled chicken on whole wheat pita with lettuce, tomatoes, cucumbers or tzatziki  o Tuna salad on whole wheat bread: tuna salad made with greek yogurt, olives, red peppers, capers, green onions o Garlic rosemary lamb pita: lamb sauted with garlic, rosemary, salt & pepper; add lettuce, cucumber, greek yogurt to pita - flavor with lemon juice and black pepper  . Seafood:  o Mediterranean grilled salmon, seasoned with garlic, basil, parsley, lemon juice and black pepper o Shrimp, lemon, and spinach whole-grain pasta salad made with low fat greek yogurt  o Seared scallops with lemon orzo  o Seared tuna steaks seasoned salt, pepper, coriander topped with tomato mixture of olives, tomatoes, olive oil, minced garlic, parsley, green onions and cappers  . Meats:  o Herbed greek chicken salad with kalamata olives, cucumber, feta  o Red bell peppers stuffed with spinach, bulgur, lean ground beef (or lentils) & topped with feta   o Kebabs: skewers of chicken, tomatoes, onions, zucchini, squash  o [place] burgers: made with red onions, mint, dill, lemon juice, feta cheese topped with roasted red peppers . Vegetarian o Cucumber salad: cucumbers, artichoke hearts, celery, red onion, feta  cheese, tossed in olive oil & lemon juice  o Hummus and whole grain pita points with a greek salad (lettuce, tomato, feta, olives, cucumbers, red onion) o Lentil soup with celery, carrots made with vegetable broth, garlic, salt and pepper  o Tabouli salad: parsley, bulgur, mint, scallions, cucumbers, tomato, radishes, lemon juice, olive oil, salt and pepper.  We will plan to see you back in 3-4 months for follow up on hyperlipidemia and elevated blood pressure  You will receive a survey after today's visit either digitally by e-mail or paper by [employer]. Your experiences and feedback matter to [place].  Please respond so we know how we are doing as we provide care for you.  Call us with any questions/concerns/needs.  It is my goal to be available to you for your health concerns.  Thanks for choosing me to be a partner in your healthcare needs!  Harlin Rain, FNP-C Family Nurse Practitioner [facility] Phone: [phone]

## 2019-10-20 NOTE — Progress Notes (Signed)
Subjective:    Patient ID: Haley Morris, female    DOB: March 13, 1951, 69 y.o.   MRN: [id number]  Haley Morris is a 69 y.o. female presenting on 10/20/2019 for Hypertension   HPI  Haley Morris presents to clinic for re-check on her previously elevated blood pressure readings.  Reports has been taking her blood pressure at home and when she goes out to the store, finding readings consistently under 130/80, but always elevated upon immediate arrival and with the automated blood pressure cuff.  Denies headaches, lightheadedness, dizziness, changes in vision, chest tightness/pressure, palpitations, leg swelling, sudden loss of speech or LOC.  Denies any acute concerns today.  Depression screen [facility] 2/9 09/26/2019 08/22/2019  Decreased Interest 0 0  Down, Depressed, Hopeless 0 0  PHQ - 2 Score 0 0    Social History   Tobacco Use  . Smoking status: Former Research scientist (life sciences)  . Smokeless tobacco: Never Used  . Tobacco comment: socially in her 65s  Substance Use Topics  . Alcohol use: Not Currently  . Drug use: Not Currently    Review of Systems  Constitutional: Negative.   HENT: Negative.   Eyes: Negative.   Respiratory: Negative.   Cardiovascular: Negative.   Gastrointestinal: Negative.   Endocrine: Negative.   Genitourinary: Negative.   Musculoskeletal: Negative.   Skin: Negative.   Allergic/Immunologic: Negative.   Neurological: Negative.   Hematological: Negative.   Psychiatric/Behavioral: Negative.    Per HPI unless specifically indicated above     Objective:    BP 126/78 (BP Location: Left Arm, Patient Position: Sitting, Cuff Size: Large)   Pulse 74   Temp (!) 97.1 F (36.2 C) (Temporal)   Resp 18   Ht 5' (1.524 m)   Wt 225 lb 9.6 oz (102.3 kg)   BMI 44.06 kg/m   Wt Readings from Last 3 Encounters:  10/20/19 225 lb 9.6 oz (102.3 kg)  09/29/19 219 lb 6.4 oz (99.5 kg)  09/26/19 212 lb (96.2 kg)    Physical Exam Vitals reviewed.  Constitutional:      General: She is not  in acute distress.    Appearance: Normal appearance. She is well-developed and well-groomed. She is obese. She is not ill-appearing or toxic-appearing.  HENT:     Head: Normocephalic.  Eyes:     General: Lids are normal. Vision grossly intact.        Right eye: No discharge.        Left eye: No discharge.     Conjunctiva/sclera: Conjunctivae normal.     Pupils: Pupils are equal, round, and reactive to light.  Cardiovascular:     Rate and Rhythm: Normal rate and regular rhythm.     Pulses: Normal pulses.     Heart sounds: Normal heart sounds. No murmur. No friction rub. No gallop.   Pulmonary:     Effort: Pulmonary effort is normal. No respiratory distress.     Breath sounds: Normal breath sounds.  Musculoskeletal:     Right lower leg: No edema.     Left lower leg: No edema.  Skin:    General: Skin is warm and dry.     Capillary Refill: Capillary refill takes less than 2 seconds.  Neurological:     General: No focal deficit present.     Mental Status: She is alert and oriented to person, place, and time.     Cranial Nerves: No cranial nerve deficit.     Sensory: No sensory deficit.  Motor: No weakness.     Coordination: Coordination normal.     Gait: Gait normal.  Psychiatric:        Attention and Perception: Attention and perception normal.        Mood and Affect: Mood and affect normal.        Speech: Speech normal.        Behavior: Behavior normal. Behavior is cooperative.        Thought Content: Thought content normal.        Cognition and Memory: Cognition and memory normal.        Judgment: Judgment normal.     Results for orders placed or performed in visit on 10/16/19  Cologuard  Result Value Ref Range   Cologuard Negative Negative      Assessment & Plan:   Problem List Items Addressed This Visit      Cardiovascular and Mediastinum   White coat syndrome without diagnosis of hypertension    Has been checking blood pressures at home and when out at the  store with readings consistently under 130/80.  Plan: 1. Continue to check blood pressure 1-2x per week and record. 2. Contact our office if having readings greater than 130/80 consistently. 3. Will plan to see you back in clinic in 3 months.         No orders of the defined types were placed in this encounter.     Follow up plan: Return in about 3 months (around 01/15/2020) for BP & HLD f.u.   Harlin Rain, [place] [facility] Nurse Practitioner [facility] 10/20/2019, 11:19 AM

## 2019-10-20 NOTE — Assessment & Plan Note (Signed)
Has been checking blood pressures at home and when out at the store with readings consistently under 130/80.  Plan: 1. Continue to check blood pressure 1-2x per week and record. 2. Contact our office if having readings greater than 130/80 consistently. 3. Will plan to see you back in clinic in 3 months.

## 2020-01-18 ENCOUNTER — Other Ambulatory Visit (HOSPITAL_COMMUNITY)
Admission: RE | Admit: 2020-01-18 | Discharge: 2020-01-18 | Disposition: A | Discharge status: Home / Self Care | Payer: PPO | Source: Ambulatory Visit | Attending: Family Medicine | Admitting: Family Medicine

## 2020-01-18 ENCOUNTER — Encounter: Payer: Self-pay | Admitting: Family Medicine

## 2020-01-18 ENCOUNTER — Other Ambulatory Visit: Payer: Self-pay

## 2020-01-18 ENCOUNTER — Ambulatory Visit (INDEPENDENT_AMBULATORY_CARE_PROVIDER_SITE_OTHER): Payer: PPO | Admitting: Family Medicine

## 2020-01-18 VITALS — BP 134/76 | HR 83 | Temp 98.2°F | Resp 17 | Ht 60.0 in | Wt 224.0 lb

## 2020-01-18 DIAGNOSIS — N760 Acute vaginitis: Secondary | ICD-10-CM | POA: Insufficient documentation

## 2020-01-18 DIAGNOSIS — R7309 Other abnormal glucose: Secondary | ICD-10-CM | POA: Diagnosis not present

## 2020-01-18 DIAGNOSIS — I10 Essential (primary) hypertension: Secondary | ICD-10-CM | POA: Diagnosis not present

## 2020-01-18 DIAGNOSIS — E785 Hyperlipidemia, unspecified: Secondary | ICD-10-CM

## 2020-01-18 NOTE — Patient Instructions (Signed)
Have your labs drawn for repeat cholesterol levels.  We will be in touch when the results have returned.  I have sent a vaginal swab to the lab for testing and will contact you when we receive the results.  Try to get exercise a minimum of 30 minutes per day at least 5 days per week as well as  adequate water intake all while measuring blood pressure a few times per week.  Keep a blood pressure log and bring back to clinic at your next visit.  If your readings are consistently over 130/80 to contact our office/send me a MyChart message and we will see you sooner.  Can try DASH and Mediterranean diet options, avoiding processed foods, lowering sodium intake, avoiding pork products, and eating a plant based diet for optimal health.  Education and discussion with patient regarding hypertension as well as the effects on the organs and body.  Specifically, we spoke about kidney disease, kidney failure, heart attack, stroke and up to and including death, as likely outcomes if non-compliant with blood pressure regulation.  Discussed how all of these habits are attached to each other and each has the effect on each other.  We will plan to see you back in 3 months for blood pressure and hyperlipidemia follow up visit  You will receive a survey after today's visit either digitally by e-mail or paper by [place] mail. Your experiences and feedback matter to [place].  Please respond so we know how we are doing as we provide care for you.  Call us with any questions/concerns/needs.  It is my goal to be available to you for your health concerns.  Thanks for choosing me to be a partner in your healthcare needs!  Harlin Rain, FNP-C Family Nurse Practitioner [facility] Phone: [phone]

## 2020-01-18 NOTE — Assessment & Plan Note (Signed)
Has concerns of a few days of white vaginal discharge.  Denies itching or odor.  Offered and declined STI testing.  Discussed concerns of BV vs. Candida.  No symptoms with exclusion of discharge.  Cervicovaginal swab taken and will treat based on results.  Patient in agreement with plan.  Plan: 1. Cervicovaginal swab sent to lab for evaluation.

## 2020-01-18 NOTE — Assessment & Plan Note (Addendum)
Uncontrolled hypertension.  BP is not at goal < 130/80.  Reports her BP at home is < 130/80, that she has white coat syndrome at our office.  Patient was previously on triamterene-HCTZ 75-50 from previous PCP. Pt is working on lifestyle modifications and declines treatment at this time Complications: Morbid obesity, hyperlipidemia  Plan: 1. Education and discussion with patient regarding hypertension as well as the effects on the organs and body.  Specifically, we spoke about kidney disease, kidney failure, heart attack, stroke and up to and including death, as likely outcomes if non-compliant with blood pressure regulation.  Discussed how all of these habits are attached to each other and each has the effect on each other. 2. Continue working on lifestyle modifications, increasing exercise 3. Obtain labs today  4. Encouraged heart healthy diet and increasing exercise to 30 minutes most days of the week, going no more than 2 days in a row without exercise. 5. Check BP 1-2 x per week at home, keep log, and bring to clinic at next appointment. 6. Follow up 3 months.

## 2020-01-18 NOTE — Progress Notes (Signed)
Subjective:    Patient ID: Haley Morris, female    DOB: 1951/02/06, 69 y.o.   MRN: [id number]  Haley Morris is a 70 y.o. female presenting on 01/18/2020 for Hypertension, Hyperlipidemia, and Vaginitis   HPI  Ms. Locy presents to clinic for follow up visit on her hypertension, hyperlipidemia and concerns for vaginal discharge.  Reports she has been working on her diet and exercise changes, has fasted for her cholesterol labs today.  Has concerns for a few days of white vaginal discharge without itching or odor.  Denies dysuria, urinary urgency, frequency, hesitancy, feeling of incomplete emptying.  Offered and declined STI testing.  Hypertension - She is checking BP at home or outside of clinic.  Readings >130/80 at home - Current medications: None, reports is white coat syndrome why BP is elevated in clinic - She is not currently symptomatic. - Pt denies headache, lightheadedness, dizziness, changes in vision, chest tightness/pressure, palpitations, leg swelling, sudden loss of speech or loss of consciousness. - She  reports no regular exercise routine. - Her diet is moderate in salt, moderate in fat, and moderate in carbohydrates.  Depression screen [facility] 2/9 09/26/2019 08/22/2019  Decreased Interest 0 0  Down, Depressed, Hopeless 0 0  PHQ - 2 Score 0 0    Social History   Tobacco Use  . Smoking status: Former Research scientist (life sciences)  . Smokeless tobacco: Never Used  . Tobacco comment: socially in her 19s  Vaping Use  . Vaping Use: Never used  Substance Use Topics  . Alcohol use: Not Currently  . Drug use: Not Currently    Review of Systems  Constitutional: Negative.   HENT: Negative.   Eyes: Negative.   Respiratory: Negative.   Cardiovascular: Negative.   Gastrointestinal: Negative.   Endocrine: Negative.   Genitourinary: Positive for vaginal discharge. Negative for decreased urine volume, difficulty urinating, dyspareunia, dysuria, enuresis, flank pain, frequency, genital sores,  hematuria, menstrual problem, pelvic pain, urgency, vaginal bleeding and vaginal pain.  Musculoskeletal: Negative.   Skin: Negative.   Allergic/Immunologic: Negative.   Neurological: Negative.   Hematological: Negative.   Psychiatric/Behavioral: Negative.    Per HPI unless specifically indicated above     Objective:    BP 134/76 (BP Location: Left Arm, Patient Position: Sitting, Cuff Size: Large)   Pulse 83   Temp 98.2 F (36.8 C) (Oral)   Resp 17   Ht 5' (1.524 m)   Wt 224 lb (101.6 kg)   SpO2 96%   BMI 43.75 kg/m   Wt Readings from Last 3 Encounters:  01/18/20 224 lb (101.6 kg)  10/20/19 225 lb 9.6 oz (102.3 kg)  09/29/19 219 lb 6.4 oz (99.5 kg)    Physical Exam Vitals reviewed.  Constitutional:      General: She is not in acute distress.    Appearance: Normal appearance. She is well-developed and well-groomed. She is morbidly obese. She is not ill-appearing or toxic-appearing.  HENT:     Head: Normocephalic and atraumatic.     Nose:     Comments: Lizbeth Bark is in place, covering mouth and nose. Eyes:     General: Lids are normal. Vision grossly intact. No scleral icterus.       Right eye: No discharge.        Left eye: No discharge.     Extraocular Movements: Extraocular movements intact.     Conjunctiva/sclera: Conjunctivae normal.     Pupils: Pupils are equal, round, and reactive to light.  Cardiovascular:  Rate and Rhythm: Normal rate and regular rhythm.     Pulses: Normal pulses.          Dorsalis pedis pulses are 2+ on the right side and 2+ on the left side.     Heart sounds: Normal heart sounds. No murmur heard.  No friction rub. No gallop.   Pulmonary:     Effort: Pulmonary effort is normal. No respiratory distress.     Breath sounds: Normal breath sounds.  Abdominal:     General: Abdomen is flat.     Palpations: Abdomen is soft. There is no hepatomegaly or splenomegaly.     Tenderness: There is no abdominal tenderness.     Hernia: There is no  hernia in the left inguinal area or right inguinal area.  Genitourinary:    General: Normal vulva.     Exam position: Lithotomy position.     Pubic Area: No rash or pubic lice.      Labia:        Right: No rash, tenderness, lesion or injury.        Left: No rash, tenderness, lesion or injury.      Urethra: No urethral pain, urethral swelling or urethral lesion.     Vagina: No signs of injury and foreign body. Vaginal discharge present. No erythema, tenderness, bleeding or lesions.     Comments: White discharge noted without excoriations or rash Musculoskeletal:     Right lower leg: No edema.     Left lower leg: No edema.  Feet:     Right foot:     Skin integrity: Skin integrity normal.     Left foot:     Skin integrity: Skin integrity normal.  Lymphadenopathy:     Lower Body: No right inguinal adenopathy. No left inguinal adenopathy.  Skin:    General: Skin is warm and dry.     Capillary Refill: Capillary refill takes less than 2 seconds.  Neurological:     General: No focal deficit present.     Mental Status: She is alert and oriented to person, place, and time.     Cranial Nerves: No cranial nerve deficit.     Sensory: No sensory deficit.     Motor: No weakness.     Coordination: Coordination normal.     Gait: Gait normal.  Psychiatric:        Attention and Perception: Attention and perception normal.        Mood and Affect: Mood and affect normal.        Speech: Speech normal.        Behavior: Behavior normal. Behavior is cooperative.        Thought Content: Thought content normal.        Cognition and Memory: Cognition and memory normal.        Judgment: Judgment normal.     Results for orders placed or performed in visit on 10/16/19  Cologuard  Result Value Ref Range   Cologuard Negative Negative      Assessment & Plan:   Problem List Items Addressed This Visit      Cardiovascular and Mediastinum   Hypertension    Uncontrolled hypertension.  BP is not at  goal < 130/80.  Reports her BP at home is < 130/80, that she has white coat syndrome at our office.  Patient was previously on triamterene-HCTZ 75-50 from previous PCP. Pt is working on lifestyle modifications and declines treatment at this time Complications: Morbid obesity, hyperlipidemia  Plan: 1. Education and discussion with patient regarding hypertension as well as the effects on the organs and body.  Specifically, we spoke about kidney disease, kidney failure, heart attack, stroke and up to and including death, as likely outcomes if non-compliant with blood pressure regulation.  Discussed how all of these habits are attached to each other and each has the effect on each other. 2. Continue working on lifestyle modifications, increasing exercise 3. Obtain labs today  4. Encouraged heart healthy diet and increasing exercise to 30 minutes most days of the week, going no more than 2 days in a row without exercise. 5. Check BP 1-2 x per week at home, keep log, and bring to clinic at next appointment. 6. Follow up 3 months.         Relevant Orders   Lipid Profile   CBC with Differential   COMPLETE METABOLIC PANEL WITH GFR     Genitourinary   Acute vaginitis    Has concerns of a few days of white vaginal discharge.  Denies itching or odor.  Offered and declined STI testing.  Discussed concerns of BV vs. Candida.  No symptoms with exclusion of discharge.  Cervicovaginal swab taken and will treat based on results.  Patient in agreement with plan.  Plan: 1. Cervicovaginal swab sent to lab for evaluation.      Relevant Orders   Cervicovaginal ancillary only     Other   Hyperlipidemia - Primary    Reports has increased exercise and worked on reducing dietary cholesterol intake, has fasted before labs this morning to have repeat lipid labs completed.  Plan: 1. Lipids labs to be drawn today 2. Treatment plan to update based on results      Relevant Orders   Lipid Profile   CBC with  Differential   COMPLETE METABOLIC PANEL WITH GFR      No orders of the defined types were placed in this encounter.     Follow up plan: Return in about 3 months (around 04/19/2020) for HTN, HLD F/U.   Harlin Rain, [place] [facility] Nurse Practitioner [facility] 01/18/2020, 1:52 PM

## 2020-01-18 NOTE — Assessment & Plan Note (Signed)
Reports has increased exercise and worked on reducing dietary cholesterol intake, has fasted before labs this morning to have repeat lipid labs completed.  Plan: 1. Lipids labs to be drawn today 2. Treatment plan to update based on results

## 2020-01-21 LAB — CERVICOVAGINAL ANCILLARY ONLY
Bacterial Vaginitis (gardnerella): NEGATIVE
Candida Glabrata: NEGATIVE
Candida Vaginitis: NEGATIVE
Comment: NEGATIVE
Comment: NEGATIVE
Comment: NEGATIVE

## 2020-01-25 LAB — COMPLETE METABOLIC PANEL WITH GFR
AG Ratio: 1.3 (calc) (ref 1.0–2.5)
ALT: 14 U/L (ref 6–29)
AST: 18 U/L (ref 10–35)
Albumin: 4.2 g/dL (ref 3.6–5.1)
Alkaline phosphatase (APISO): 75 U/L (ref 37–153)
BUN: 19 mg/dL (ref 7–25)
CO2: 28 mmol/L (ref 20–32)
Calcium: 9.7 mg/dL (ref 8.6–10.4)
Chloride: 105 mmol/L (ref 98–110)
Creat: 0.75 mg/dL (ref 0.50–0.99)
GFR, Est African American: 95 mL/min/{1.73_m2} (ref >=60)
GFR, Est Non African American: 82 mL/min/{1.73_m2} (ref >=60)
Globulin: 3.2 g/dL (calc) (ref 1.9–3.7)
Glucose, Bld: 123 mg/dL — ABNORMAL HIGH (ref 65–99)
Potassium: 4.3 mmol/L (ref 3.5–5.3)
Sodium: 139 mmol/L (ref 135–146)
Total Bilirubin: 0.6 mg/dL (ref 0.2–1.2)
Total Protein: 7.4 g/dL (ref 6.1–8.1)

## 2020-01-25 LAB — CBC WITH DIFFERENTIAL/PLATELET
Absolute Monocytes: 488 cells/uL (ref 200–950)
Basophils Absolute: 32 cells/uL (ref 0–200)
Basophils Relative: 0.4 %
Eosinophils Absolute: 152 cells/uL (ref 15–500)
Eosinophils Relative: 1.9 %
HCT: 39.5 % (ref 35.0–45.0)
Hemoglobin: 13.1 g/dL (ref 11.7–15.5)
Lymphs Abs: 2240 cells/uL (ref 850–3900)
MCH: 30.4 pg (ref 27.0–33.0)
MCHC: 33.2 g/dL (ref 32.0–36.0)
MCV: 91.6 fL (ref 80.0–100.0)
MPV: 10.5 fL (ref 7.5–12.5)
Monocytes Relative: 6.1 %
Neutro Abs: 5088 cells/uL (ref 1500–7800)
Neutrophils Relative %: 63.6 %
Platelets: 315 10*3/uL (ref 140–400)
RBC: 4.31 10*6/uL (ref 3.80–5.10)
RDW: 12.9 % (ref 11.0–15.0)
Total Lymphocyte: 28 %
WBC: 8 10*3/uL (ref 3.8–10.8)

## 2020-01-25 LAB — LIPID PANEL
Cholesterol: 239 mg/dL — ABNORMAL HIGH (ref <200)
HDL: 52 mg/dL (ref >=50)
LDL Cholesterol (Calc): 165 mg/dL (calc) — ABNORMAL HIGH
Non-HDL Cholesterol (Calc): 187 mg/dL (calc) — ABNORMAL HIGH (ref <130)
Total CHOL/HDL Ratio: 4.6 (calc) (ref <5.0)
Triglycerides: 106 mg/dL (ref <150)

## 2020-01-25 LAB — HEMOGLOBIN A1C W/OUT EAG: Hgb A1c MFr Bld: 5.7 % of total Hgb — ABNORMAL HIGH (ref <5.7)

## 2020-03-04 DIAGNOSIS — H43812 Vitreous degeneration, left eye: Secondary | ICD-10-CM | POA: Diagnosis not present

## 2020-03-20 ENCOUNTER — Other Ambulatory Visit: Payer: Self-pay | Admitting: Family Medicine

## 2020-04-24 ENCOUNTER — Encounter: Payer: Self-pay | Admitting: Family Medicine

## 2020-04-24 ENCOUNTER — Other Ambulatory Visit: Payer: Self-pay

## 2020-04-24 ENCOUNTER — Ambulatory Visit (INDEPENDENT_AMBULATORY_CARE_PROVIDER_SITE_OTHER): Payer: PPO | Admitting: Family Medicine

## 2020-04-24 VITALS — BP 142/78 | HR 92 | Temp 98.0°F | Ht 60.0 in | Wt 221.0 lb

## 2020-04-24 DIAGNOSIS — N898 Other specified noninflammatory disorders of vagina: Secondary | ICD-10-CM | POA: Diagnosis not present

## 2020-04-24 DIAGNOSIS — I10 Essential (primary) hypertension: Secondary | ICD-10-CM

## 2020-04-24 DIAGNOSIS — E785 Hyperlipidemia, unspecified: Secondary | ICD-10-CM

## 2020-04-24 DIAGNOSIS — Z1231 Encounter for screening mammogram for malignant neoplasm of breast: Secondary | ICD-10-CM | POA: Insufficient documentation

## 2020-04-24 NOTE — Progress Notes (Signed)
Subjective:    Patient ID: Haley Morris, female    DOB: [birth year], 69 y.o.   MRN: [id number]  Haley Morris is a 69 y.o. female presenting on 04/24/2020 for Hypertension (f/u) and Hyperlipidemia   HPI   Haley Morris presents to clinic for a follow up on her hypertension and hyperlipidemia.  Reports she has made some dietary changes working towards reducing her dietary cholesterol intake and continues to stay active with working towards reducing her blood pressure.  Hypertension - She is not checking BP at home or outside of clinic.    - Current medications: None, declines medications - She is not currently symptomatic. - Pt denies headache, lightheadedness, dizziness, changes in vision, chest tightness/pressure, palpitations, leg swelling, sudden loss of speech or loss of consciousness. - She  reports no regular exercise routine. - Her diet is high in salt, high in fat, and high in carbohydrates.   Depression screen [facility] 2/9 04/24/2020 09/26/2019 08/22/2019  Decreased Interest 0 0 0  Down, Depressed, Hopeless 0 0 0  PHQ - 2 Score 0 0 0  Altered sleeping 0 - -  Tired, decreased energy 0 - -  Change in appetite 0 - -  Feeling bad or failure about yourself  0 - -  Trouble concentrating 0 - -  Moving slowly or fidgety/restless 0 - -  Suicidal thoughts 0 - -  PHQ-9 Score 0 - -    Social History   Tobacco Use  . Smoking status: Former Research scientist (life sciences)  . Smokeless tobacco: Never Used  . Tobacco comment: socially in her 37s  Vaping Use  . Vaping Use: Never used  Substance Use Topics  . Alcohol use: Not Currently  . Drug use: Not Currently    Review of Systems  Constitutional: Negative.   HENT: Negative.   Eyes: Negative.   Respiratory: Negative.   Cardiovascular: Negative.   Gastrointestinal: Negative.   Endocrine: Negative.   Genitourinary: Negative.   Musculoskeletal: Negative.   Skin: Negative.   Allergic/Immunologic: Negative.   Neurological: Negative.   Hematological:  Negative.   Psychiatric/Behavioral: Negative.    Per HPI unless specifically indicated above     Objective:    BP (!) 142/78 (BP Location: Right Arm, Patient Position: Sitting, Cuff Size: Large)   Pulse 92   Temp 98 F (36.7 C) (Oral)   Ht 5' (1.524 m)   Wt 221 lb (100.2 kg)   SpO2 100%   BMI 43.16 kg/m   Wt Readings from Last 3 Encounters:  04/24/20 221 lb (100.2 kg)  01/18/20 224 lb (101.6 kg)  10/20/19 225 lb 9.6 oz (102.3 kg)    Physical Exam Vitals and nursing note reviewed.  Constitutional:      General: She is not in acute distress.    Appearance: Normal appearance. She is well-developed and well-groomed. She is morbidly obese. She is not ill-appearing or toxic-appearing.  HENT:     Head: Normocephalic and atraumatic.     Nose:     Comments: Lizbeth Bark is in place, covering mouth and nose. Eyes:     General: Lids are normal. Vision grossly intact.        Right eye: No discharge.        Left eye: No discharge.     Extraocular Movements: Extraocular movements intact.     Conjunctiva/sclera: Conjunctivae normal.     Pupils: Pupils are equal, round, and reactive to light.  Cardiovascular:     Rate and Rhythm: Normal rate  and regular rhythm.     Pulses: Normal pulses.          Dorsalis pedis pulses are 2+ on the right side and 2+ on the left side.     Heart sounds: Normal heart sounds. No murmur heard.  No friction rub. No gallop.   Pulmonary:     Effort: Pulmonary effort is normal. No respiratory distress.     Breath sounds: Normal breath sounds.  Musculoskeletal:     Right lower leg: No edema.     Left lower leg: No edema.  Skin:    General: Skin is warm and dry.     Capillary Refill: Capillary refill takes less than 2 seconds.  Neurological:     General: No focal deficit present.     Mental Status: She is alert and oriented to person, place, and time.  Psychiatric:        Attention and Perception: Attention and perception normal.        Mood and Affect:  Mood and affect normal.        Speech: Speech normal.        Behavior: Behavior normal. Behavior is cooperative.        Thought Content: Thought content normal.        Cognition and Memory: Cognition and memory normal.        Judgment: Judgment normal.    Results for orders placed or performed in visit on 01/18/20  Lipid Profile  Result Value Ref Range   Cholesterol 239 (H) <200 mg/dL   HDL 52 > OR = 50 mg/dL   Triglycerides 106 <150 mg/dL   LDL Cholesterol (Calc) 165 (H) mg/dL (calc)   Total CHOL/HDL Ratio 4.6 <5.0 (calc)   Non-HDL Cholesterol (Calc) 187 (H) <130 mg/dL (calc)  CBC with Differential  Result Value Ref Range   WBC 8.0 3.8 - 10.8 Thousand/uL   RBC 4.31 3.80 - 5.10 Million/uL   Hemoglobin 13.1 11.7 - 15.5 g/dL   HCT 39.5 35 - 45 %   MCV 91.6 80.0 - 100.0 fL   MCH 30.4 27.0 - 33.0 pg   MCHC 33.2 32.0 - 36.0 g/dL   RDW 12.9 11.0 - 15.0 %   Platelets 315 140 - 400 Thousand/uL   MPV 10.5 7.5 - 12.5 fL   Neutro Abs 5,088 1,500 - 7,800 cells/uL   Lymphs Abs 2,240 850 - 3,900 cells/uL   Absolute Monocytes 488 200 - 950 cells/uL   Eosinophils Absolute 152 15.0 - 500.0 cells/uL   Basophils Absolute 32 0.0 - 200.0 cells/uL   Neutrophils Relative % 63.6 %   Total Lymphocyte 28.0 %   Monocytes Relative 6.1 %   Eosinophils Relative 1.9 %   Basophils Relative 0.4 %  COMPLETE METABOLIC PANEL WITH GFR  Result Value Ref Range   Glucose, Bld 123 (H) 65 - 99 mg/dL   BUN 19 7 - 25 mg/dL   Creat 0.75 0.50 - 0.99 mg/dL   GFR, Est Non African American 82 > OR = 60 mL/min/1.64m2   GFR, Est African American 95 > OR = 60 mL/min/1.52m2   BUN/Creatinine Ratio NOT APPLICABLE 6 - 22 (calc)   Sodium 139 135 - 146 mmol/L   Potassium 4.3 3.5 - 5.3 mmol/L   Chloride 105 98 - 110 mmol/L   CO2 28 20 - 32 mmol/L   Calcium 9.7 8.6 - 10.4 mg/dL   Total Protein 7.4 6.1 - 8.1 g/dL   Albumin 4.2 3.6 - 5.1  g/dL   Globulin 3.2 1.9 - 3.7 g/dL (calc)   AG Ratio 1.3 1.0 - 2.5 (calc)   Total  Bilirubin 0.6 0.2 - 1.2 mg/dL   Alkaline phosphatase (APISO) 75 37 - 153 U/L   AST 18 10 - 35 U/L   ALT 14 6 - 29 U/L  Hemoglobin A1C w/out eAG  Result Value Ref Range   Hgb A1c MFr Bld 5.7 (H) <5.7 % of total Hgb  Cervicovaginal ancillary only  Result Value Ref Range   Bacterial Vaginitis (gardnerella) Negative    Candida Vaginitis Negative    Candida Glabrata Negative    Comment      Normal Reference Range Bacterial Vaginosis - Negative   Comment Normal Reference Range Candida Species - Negative    Comment Normal Reference Range Candida Galbrata - Negative       Assessment & Plan:   Problem List Items Addressed This Visit      Cardiovascular and Mediastinum   Hypertension    Uncontrolled hypertension.  BP is not at goal < 130/80.  Pt reports working on lifestyle modifications.  Taking medications tolerating well without side effects.  Declines taking medications, was previously on triameterene-HCTZ 75-50 from her previous PCP.  States her BP is from white coat syndrome.  Reviewed seriousness of uncontrolled hypertension as it related to heart attack, stroke, up to and including death.  Patient verbalized understanding. Complications:  Morbid obesity, HLD, prediabetes  Plan: 1. Work towards reducing weight and increasing water intake 2. Obtain labs at next visit  3. Encouraged heart healthy diet and increasing exercise to 30 minutes most days of the week, going no more than 2 days in a row without exercise. 4. Check BP 1-2 x per week at home, keep log, and bring to clinic at next appointment. 5. Follow up 3 months.         Other   Hyperlipidemia - Primary    Will have labs repeated today for re-evaluation of lipids      Relevant Orders   Lipid Profile   Vaginal discharge    Reported continued vaginal discharge.  Swab sent to lab and negative for candida and BV.  Discussed hygiene routine and patient reports bathes once per week in a bath and showers 3-4x per week.  Will  refer to GYN for additional evaluation/testing.      Relevant Orders   Ambulatory referral to Obstetrics / Gynecology   Encounter for screening mammogram for malignant neoplasm of breast    Updated mammo order provided.  Due 09/2020.      Relevant Orders   MM 3D SCREEN BREAST BILATERAL      No orders of the defined types were placed in this encounter.   Follow up plan: No follow-ups on file.   Harlin Rain, [place] [facility] Nurse Practitioner [facility] 04/24/2020, 10:04 AM

## 2020-04-24 NOTE — Assessment & Plan Note (Signed)
Updated mammo order provided.  Due 09/2020.

## 2020-04-24 NOTE — Assessment & Plan Note (Signed)
Will have labs repeated today for re-evaluation of lipids

## 2020-04-24 NOTE — Patient Instructions (Signed)
Have your labs drawn and we will contact you with the results.  It is important that we continue to get your blood pressure closer to 130/80.  Working towards reducing your weight, reducing dietary cholesterol intake and increasing exercise can all help this.  Try to get exercise a minimum of 30 minutes per day at least 5 days per week as well as  adequate water intake all while measuring blood pressure a few times per week.  Keep a blood pressure log and bring back to clinic at your next visit.  If your readings are consistently over 130/80 to contact our office/send me a MyChart message and we will see you sooner.  Can try DASH and Mediterranean diet options, avoiding processed foods, lowering sodium intake, avoiding pork products, and eating a plant based diet for optimal health.  Education and discussion with patient regarding hypertension as well as the effects on the organs and body.  Specifically, we spoke about kidney disease, kidney failure, heart attack, stroke and up to and including death, as likely outcomes if non-compliant with blood pressure regulation.  Discussed how all of these habits are attached to each other and each has the effect on each other.  We will plan to see you back in 3 months for hypertension and cholesterol follow up visit  You will receive a survey after today's visit either digitally by e-mail or paper by [employer] mail. Your experiences and feedback matter to [place].  Please respond so we know how we are doing as we provide care for you.  Call us with any questions/concerns/needs.  It is my goal to be available to you for your health concerns.  Thanks for choosing me to be a partner in your healthcare needs!  Harlin Rain, FNP-C Family Nurse Practitioner [facility] Phone: [phone]

## 2020-04-24 NOTE — Assessment & Plan Note (Signed)
Uncontrolled hypertension.  BP is not at goal < 130/80.  Pt reports working on lifestyle modifications.  Taking medications tolerating well without side effects.  Declines taking medications, was previously on triameterene-HCTZ 75-50 from her previous PCP.  States her BP is from white coat syndrome.  Reviewed seriousness of uncontrolled hypertension as it related to heart attack, stroke, up to and including death.  Patient verbalized understanding. Complications:  Morbid obesity, HLD, prediabetes  Plan: 1. Work towards reducing weight and increasing water intake 2. Obtain labs at next visit  3. Encouraged heart healthy diet and increasing exercise to 30 minutes most days of the week, going no more than 2 days in a row without exercise. 4. Check BP 1-2 x per week at home, keep log, and bring to clinic at next appointment. 5. Follow up 3 months.

## 2020-04-24 NOTE — Assessment & Plan Note (Signed)
Reported continued vaginal discharge.  Swab sent to lab and negative for candida and BV.  Discussed hygiene routine and patient reports bathes once per week in a bath and showers 3-4x per week.  Will refer to GYN for additional evaluation/testing.

## 2020-04-25 ENCOUNTER — Encounter: Payer: Self-pay | Admitting: Advanced Practice Midwife

## 2020-04-25 ENCOUNTER — Telehealth: Payer: Self-pay | Admitting: Obstetrics & Gynecology

## 2020-04-25 ENCOUNTER — Ambulatory Visit (INDEPENDENT_AMBULATORY_CARE_PROVIDER_SITE_OTHER): Payer: PPO | Admitting: Advanced Practice Midwife

## 2020-04-25 VITALS — BP 140/84 | Ht 62.0 in | Wt 222.8 lb

## 2020-04-25 DIAGNOSIS — L909 Atrophic disorder of skin, unspecified: Secondary | ICD-10-CM | POA: Diagnosis not present

## 2020-04-25 LAB — LIPID PANEL
Cholesterol: 254 mg/dL — ABNORMAL HIGH (ref <200)
HDL: 48 mg/dL — ABNORMAL LOW (ref >=50)
LDL Cholesterol (Calc): 180 mg/dL (calc) — ABNORMAL HIGH
Non-HDL Cholesterol (Calc): 206 mg/dL (calc) — ABNORMAL HIGH (ref <130)
Total CHOL/HDL Ratio: 5.3 (calc) — ABNORMAL HIGH (ref <5.0)
Triglycerides: 124 mg/dL (ref <150)

## 2020-04-25 NOTE — Telephone Encounter (Signed)
Haley Morris medical referring for Vaginal discharge. Called and left voicemail for patient to call back to be scheduled.

## 2020-04-25 NOTE — Progress Notes (Signed)
Patient ID: Haley Morris, female   DOB: 30-Apr-1951, 69 y.o.   MRN: [id number]  Reason for Consult: Gynecologic Exam (vaginal discharge )   Referred by Verl Bangs, FNP   Subjective:  HPI:  Haley Morris is a 69 y.o. female being seen for vaginal changes. She first noticed a white area near her urethra about 5 months ago. She denies discharge, itching, irritation, burning or odor. She has not been sexually active for nearly 20 years. She reports discovering the change by looking at the area- not feeling anything different.   After the exam, we discussed that it is unlikely Lichen Sclerosis. There is no thickening of the skin, no patches, no itching. The appearance of the vaginal mucosa appears to be related to post menopausal changes- thinning and pale. The patient is reassured. She has mammograms scheduled through her PCP. She has no other concerns.  Past Medical History:  Diagnosis Date   Arthritis    knees   Family History  Problem Relation Age of Onset   Heart attack Father    Lung cancer Brother    Past Surgical History:  Procedure Laterality Date   ABDOMINAL HYSTERECTOMY     BREAST BIOPSY Right 10/05/2019   [place] bx, path pending, heart marker at 2:30   CATARACT EXTRACTION W/PHACO Left 11/08/2018   Procedure: CATARACT EXTRACTION PHACO AND INTRAOCULAR LENS PLACEMENT ([place]) LEFT;  Surgeon: Eulogio Bear, MD;  Location: [facility];  Service: Ophthalmology;  Laterality: Left;   CATARACT EXTRACTION W/PHACO Right 12/05/2018   Procedure: CATARACT EXTRACTION PHACO AND INTRAOCULAR LENS PLACEMENT ([facility])  RIGHT;  Surgeon: Eulogio Bear, MD;  Location: [facility];  Service: Ophthalmology;  Laterality: Right;   CHOLECYSTECTOMY     COLONOSCOPY     LESION EXCISION     nose   MASS EXCISION Left    thumb   PARTIAL HYSTERECTOMY      Short Social History:  Social History   Tobacco Use   Smoking status: Former Smoker   Smokeless tobacco:  Never Used   Tobacco comment: socially in her 86s  Substance Use Topics   Alcohol use: Not Currently    Allergies  Allergen Reactions   Cefdinir Swelling    "throat closes"   Erythromycin     Cousin is allergic   Hydromet [Hydrocodone-Homatropine] Nausea And Vomiting    Current Outpatient Medications  Medication Sig Dispense Refill   acetaminophen (TYLENOL) 500 MG tablet Take 500 mg by mouth every 6 (six) hours as needed.     Ascorbic Acid (VITAMIN C PO) Take by mouth daily.     B Complex Vitamins (VITAMIN B COMPLEX PO) Take by mouth daily.     Calcium Carb-Cholecalciferol (CALCIUM 1000 + D PO) Take by mouth.     CINNAMON PO Take by mouth daily.     ELDERBERRY PO Take by mouth.     Ginger, Zingiber officinalis, (GINGER ROOT) 550 MG CAPS Take by mouth.      Multiple Vitamin (MULTIVITAMIN) tablet Take 1 tablet by mouth daily.     TURMERIC PO Take by mouth daily.     No current facility-administered medications for this visit.    Review of Systems  Constitutional: Negative for chills and fever.  HENT: Negative for congestion, ear discharge, ear pain, hearing loss, sinus pain and sore throat.   Eyes: Negative for blurred vision and double vision.  Respiratory: Negative for cough, shortness of breath and wheezing.   Cardiovascular: Negative  for chest pain, palpitations and leg swelling.  Gastrointestinal: Negative for abdominal pain, blood in stool, constipation, diarrhea, heartburn, melena, nausea and vomiting.  Genitourinary: Negative for dysuria, flank pain, frequency, hematuria and urgency.       Positive for white area in vagina  Musculoskeletal: Negative for back pain, joint pain and myalgias.  Skin: Negative for itching and rash.  Neurological: Negative for dizziness, tingling, tremors, sensory change, speech change, focal weakness, seizures, loss of consciousness, weakness and headaches.  Endo/Heme/Allergies: Negative for environmental allergies. Does not  bruise/bleed easily.  Psychiatric/Behavioral: Negative for depression, hallucinations, memory loss, substance abuse and suicidal ideas. The patient is not nervous/anxious and does not have insomnia.         Objective:  Objective   Vitals:   04/25/20 1459  BP: 140/84  Weight: 222 lb 12.8 oz (101.1 kg)  Height: 5\' 2"  (1.575 m)   Body mass index is 40.75 kg/m. Constitutional: Well nourished, well developed female in no acute distress.  HEENT: normal Skin: Warm and dry.  Cardiovascular: Regular rate and rhythm.   Respiratory: Clear to auscultation bilateral. Normal respiratory effort Neuro: DTRs 2+, Cranial nerves grossly intact Psych: Alert and Oriented x3. No memory deficits. Normal mood and affect.  MS: normal gait, normal bilateral lower extremity ROM/strength/stability.  Pelvic exam:  is not limited by body habitus EGBUS: within normal limits Vagina: within normal limits, post menopausal thinning and pale mucosa generally, whiter skin around urethra, non tender to palpate Cervix: not evaluated   Assessment/Plan:     68 y.o. female with post menopausal thinning vaginal mucosa  Return to clinic as needed for gyn concerns   [facility] 04/25/2020, 5:23 PM

## 2020-04-25 NOTE — Progress Notes (Signed)
Pt was referred over from [place] for vaginal discharge

## 2020-09-13 ENCOUNTER — Other Ambulatory Visit: Payer: Self-pay

## 2020-09-13 ENCOUNTER — Ambulatory Visit
Admission: RE | Admit: 2020-09-13 | Discharge: 2020-09-13 | Disposition: A | Discharge status: Home / Self Care | Payer: PPO | Source: Ambulatory Visit | Attending: Family Medicine | Admitting: Family Medicine

## 2020-09-13 DIAGNOSIS — Z1231 Encounter for screening mammogram for malignant neoplasm of breast: Secondary | ICD-10-CM | POA: Insufficient documentation

## 2020-10-24 ENCOUNTER — Ambulatory Visit (INDEPENDENT_AMBULATORY_CARE_PROVIDER_SITE_OTHER): Payer: PPO | Admitting: Internal Medicine

## 2020-10-24 ENCOUNTER — Telehealth: Payer: Self-pay | Admitting: Internal Medicine

## 2020-10-24 ENCOUNTER — Encounter: Payer: Self-pay | Admitting: Internal Medicine

## 2020-10-24 ENCOUNTER — Other Ambulatory Visit: Payer: Self-pay

## 2020-10-24 VITALS — BP 128/82 | HR 80 | Wt 228.1 lb

## 2020-10-24 DIAGNOSIS — M810 Age-related osteoporosis without current pathological fracture: Secondary | ICD-10-CM | POA: Diagnosis not present

## 2020-10-24 DIAGNOSIS — R030 Elevated blood-pressure reading, without diagnosis of hypertension: Secondary | ICD-10-CM

## 2020-10-24 DIAGNOSIS — E782 Mixed hyperlipidemia: Secondary | ICD-10-CM | POA: Diagnosis not present

## 2020-10-24 DIAGNOSIS — R7303 Prediabetes: Secondary | ICD-10-CM | POA: Diagnosis not present

## 2020-10-24 DIAGNOSIS — Z0289 Encounter for other administrative examinations: Secondary | ICD-10-CM | POA: Diagnosis not present

## 2020-10-24 NOTE — Assessment & Plan Note (Signed)
Continue calcium and vitamin D Encouraged daily weightbearing exercise Will need to discuss treatment of osteoporosis at her annual exam

## 2020-10-24 NOTE — Assessment & Plan Note (Signed)
Encouraged her to consume a low-fat diet We will check c-Met and lipid panel at annual exam If LDL remains elevated, will need to start statin therapy

## 2020-10-24 NOTE — Progress Notes (Signed)
Subjective:    Patient ID: Haley Morris, female    DOB: 1950/07/31, 70 y.o.   MRN: [id number]  HPI  Pt presents to the clinic today for form completion. She is moving into a new apartment and requesting accomodation for a higher commode, tub modification and grab bars in the tub.  OA: Mainly in her knees L>R. She has difficulty getting from a sitting to a standing position. She also has difficulty stepping over things. She denies frequent falls. She takes Tylenol and TumericOTC with some relief of symptoms.   White Coat HTN: She reports her BP at home runs 120/70's. She has been under a lot of stress lately, preparing to move and recently had a death in the family. Her electric BP today is 170/78, manual BP 128/82.  HLD: Her last LDL was 180, triglycerides 124, 04/2020. She is not taking any cholesterol lowering medication. She does not consume a low fat diet.  Prediabetes: Her last A1C was 5.7%, 01/2020. She is taking Cinnamon OTC but is not taking any oral diabetic medication. She does not check her sugars.  Osteoporosis: Bone density from 08/2019 reviewed. She is taking Calcium and Vit D OTC. She does not get much weight bearing exercise secondary to her knee pain.   Review of Systems      Past Medical History:  Diagnosis Date  . Arthritis    knees    Current Outpatient Medications  Medication Sig Dispense Refill  . acetaminophen (TYLENOL) 500 MG tablet Take 500 mg by mouth every 6 (six) hours as needed.    . Ascorbic Acid (VITAMIN C PO) Take by mouth daily.    . B Complex Vitamins (VITAMIN B COMPLEX PO) Take by mouth daily.    . Calcium Carb-Cholecalciferol (CALCIUM 1000 + D PO) Take by mouth.    Marland Kitchen CINNAMON PO Take by mouth daily.    Marland Kitchen ELDERBERRY PO Take by mouth.    . Ginger, Zingiber officinalis, (GINGER ROOT) 550 MG CAPS Take by mouth.     . Multiple Vitamin (MULTIVITAMIN) tablet Take 1 tablet by mouth daily.    . TURMERIC PO Take by mouth daily.     No current  facility-administered medications for this visit.    Allergies  Allergen Reactions  . Cefdinir Swelling    "throat closes"  . Erythromycin     Cousin is allergic  . Hydromet [Hydrocodone Bit-Homatrop Mbr] Nausea And Vomiting    Family History  Problem Relation Age of Onset  . Heart attack Father   . Lung cancer Brother     Social History   Socioeconomic History  . Marital status: Widowed    Spouse name: Not on file  . Number of children: Not on file  . Years of education: Not on file  . Highest education level: Not on file  Occupational History  . Occupation: retired   Tobacco Use  . Smoking status: Former Research scientist (life sciences)  . Smokeless tobacco: Never Used  . Tobacco comment: socially in her 26s  Vaping Use  . Vaping Use: Never used  Substance and Sexual Activity  . Alcohol use: Not Currently  . Drug use: Not Currently  . Sexual activity: Not Currently  Other Topics Concern  . Not on file  Social History Narrative  . Not on file   Social Determinants of Health   Financial Resource Strain: Not on file  Food Insecurity: Not on file  Transportation Needs: Not on file  Physical Activity: Not on file  Stress: Not on file  Social Connections: Not on file  Intimate Partner Violence: Not on file     Constitutional: Denies fever, malaise, fatigue, headache or abrupt weight changes.  HEENT: Denies eye pain, eye redness, ear pain, ringing in the ears, wax buildup, runny nose, nasal congestion, bloody nose, or sore throat. Respiratory: Denies difficulty breathing, shortness of breath, cough or sputum production.   Cardiovascular: Denies chest pain, chest tightness, palpitations or swelling in the hands or feet.  Gastrointestinal: Denies abdominal pain, bloating, constipation, diarrhea or blood in the stool.  GU: Denies urgency, frequency, pain with urination, burning sensation, blood in urine, odor or discharge. Musculoskeletal: Pt reports bilateral knee pain. Denies decrease in  range of motion, difficulty with gait, muscle pain or joint swelling.  Skin: Denies redness, rashes, lesions or ulcercations.  Neurological: Denies dizziness, difficulty with memory, difficulty with speech or problems with balance and coordination.  Psych: Denies anxiety, depression, SI/HI.  No other specific complaints in a complete review of systems (except as listed in HPI above).  Objective:   Physical Exam   BP (!) 170/78   Pulse 80   Wt 228 lb 2 oz (103.5 kg)   SpO2 99%   BMI 41.72 kg/m   Wt Readings from Last 3 Encounters:  04/25/20 222 lb 12.8 oz (101.1 kg)  04/24/20 221 lb (100.2 kg)  01/18/20 224 lb (101.6 kg)    General: Appears her stated age, obese, in NAD. Skin: Warm, dry and intact.  HEENT: Head: normal shape and size; Eyes: sclera white, strabismus noted;  Cardiovascular: Normal rate and rhythm. S1,S2 noted.  No murmur, rubs or gallops noted. No JVD or BLE edema. No carotid bruits noted. Pulmonary/Chest: Normal effort and positive vesicular breath sounds. No respiratory distress. No wheezes, rales or ronchi noted.  Musculoskeletal: Joint enlargement of bilateral knees. Difficulty getting from a sitting to a standing position. Gait slow and steady without device. Neurological: Alert and oriented.    BMET    Component Value Date/Time   NA 139 01/18/2020 1033   K 4.3 01/18/2020 1033   CL 105 01/18/2020 1033   CO2 28 01/18/2020 1033   GLUCOSE 123 (H) 01/18/2020 1033   BUN 19 01/18/2020 1033   CREATININE 0.75 01/18/2020 1033   CALCIUM 9.7 01/18/2020 1033   GFRNONAA 82 01/18/2020 1033   GFRAA 95 01/18/2020 1033    Lipid Panel     Component Value Date/Time   CHOL 254 (H) 04/24/2020 0919   TRIG 124 04/24/2020 0919   HDL 48 (L) 04/24/2020 0919   CHOLHDL 5.3 (H) 04/24/2020 0919   LDLCALC 180 (H) 04/24/2020 0919    CBC    Component Value Date/Time   WBC 8.0 01/18/2020 1033   RBC 4.31 01/18/2020 1033   HGB 13.1 01/18/2020 1033   HCT 39.5 01/18/2020  1033   PLT 315 01/18/2020 1033   MCV 91.6 01/18/2020 1033   MCH 30.4 01/18/2020 1033   MCHC 33.2 01/18/2020 1033   RDW 12.9 01/18/2020 1033   LYMPHSABS 2,240 01/18/2020 1033   EOSABS 152 01/18/2020 1033   BASOSABS 32 01/18/2020 1033    Hgb A1C Lab Results  Component Value Date   HGBA1C 5.7 (H) 01/18/2020           Assessment & Plan:   Encounter for Form Completion with Patient:  [place] Form completed, copy scanned into chart, original given to patient  RTC in 6 months for you annual exam Webb Silversmith, NP This visit  occurred during the SARS-CoV-2 public health emergency.  Safety protocols were in place, including screening questions prior to the visit, additional usage of staff PPE, and extensive cleaning of exam room while observing appropriate contact time as indicated for disinfecting solutions.

## 2020-10-24 NOTE — Assessment & Plan Note (Signed)
Elevated with electric cough, normal with manual cuff Reinforced DASH diet and exercise for weight loss No indication for medications at this time Will monitor

## 2020-10-24 NOTE — Assessment & Plan Note (Signed)
Encouraged her to consume a low-carb diet and exercise for weight loss Will check A1c at annual exam

## 2020-10-24 NOTE — Patient Instructions (Signed)
Arthritis Arthritis means joint pain. It can also mean joint disease. A joint is a place where bones come together. There are more than 100 types of arthritis. What are the causes? This condition may be caused by:  Wear and tear of a joint. This is the most common cause.  A lot of acid in the blood, which leads to pain in the joint (gout).  Pain and swelling (inflammation) in a joint.  Infection of a joint.  Injuries in the joint.  A reaction to medicines (allergy). In some cases, the cause may not be known. What are the signs or symptoms? Symptoms of this condition include:  Redness at a joint.  Swelling at a joint.  Stiffness at a joint.  Warmth coming from the joint.  A fever.  A feeling of being sick. How is this treated? This condition may be treated with:  Treating the cause, if it is known.  Rest.  Raising (elevating) the joint.  Putting cold or hot packs on the joint.  Medicines to treat symptoms and reduce pain and swelling.  Shots of medicines (cortisone) into the joint. You may also be told to make changes in your life, such as doing exercises and losing weight. Follow these instructions at home: Medicines  Take over-the-counter and prescription medicines only as told by your doctor.  Do not take aspirin for pain if your doctor says that you may have gout. Activity  Rest your joint if your doctor tells you to.  Avoid activities that make the pain worse.  Exercise your joint regularly as told by your doctor. Try doing exercises like: ? Swimming. ? Water aerobics. ? Biking. ? Walking. Managing pain, stiffness, and swelling  If told, put ice on the affected area. ? Put ice in a plastic bag. ? Place a towel between your skin and the bag. ? Leave the ice on for 20 minutes, 2-3 times per day.  If your joint is swollen, raise (elevate) it above the level of your heart if told by your doctor.  If your joint feels stiff in the morning, try  taking a warm shower.  If told, put heat on the affected area. Do this as often as told by your doctor. Use the heat source that your doctor recommends, such as a moist heat pack or a heating pad. If you have diabetes, do not apply heat without asking your doctor. To apply heat: ? Place a towel between your skin and the heat source. ? Leave the heat on for 20-30 minutes. ? Remove the heat if your skin turns bright red. This is very important if you are unable to feel pain, heat, or cold. You may have a greater risk of getting burned.      General instructions  Do not use any products that contain nicotine or tobacco, such as cigarettes, e-cigarettes, and chewing tobacco. If you need help quitting, ask your doctor.  Keep all follow-up visits as told by your doctor. This is important. Contact a doctor if:  The pain gets worse.  You have a fever. Get help right away if:  You have very bad pain in your joint.  You have swelling in your joint.  Your joint is red.  Many joints become painful and swollen.  You have very bad back pain.  Your leg is very weak.  You cannot control your pee (urine) or poop (stool). Summary  Arthritis means joint pain. It can also mean joint disease. A joint is a  place where bones come together.  The most common cause of this condition is wear and tear of a joint.  Symptoms of this condition include redness, swelling, or stiffness of the joint.  This condition is treated with rest, raising the joint, medicines, and putting cold or hot packs on the joint.  Follow your doctor's instructions about medicines, activity, exercises, and other home care treatments. This information is not intended to replace advice given to you by your health care provider. Make sure you discuss any questions you have with your health care provider. Document Revised: 05/09/2018 Document Reviewed: 05/09/2018 Elsevier Patient Education  2021 [employer].

## 2020-10-24 NOTE — Telephone Encounter (Signed)
[facility] with [facility] states she needs the paperwork for apartment accomendations faxed directly from the office. Pt picked up, and they cannot accept this   Fax [phone]  cb [phone]

## 2020-11-12 ENCOUNTER — Ambulatory Visit: Payer: PPO

## 2021-01-03 DIAGNOSIS — H43813 Vitreous degeneration, bilateral: Secondary | ICD-10-CM | POA: Diagnosis not present

## 2021-04-28 ENCOUNTER — Ambulatory Visit (INDEPENDENT_AMBULATORY_CARE_PROVIDER_SITE_OTHER): Payer: PPO | Admitting: Internal Medicine

## 2021-04-28 ENCOUNTER — Other Ambulatory Visit: Payer: Self-pay

## 2021-04-28 ENCOUNTER — Encounter: Payer: Self-pay | Admitting: Internal Medicine

## 2021-04-28 VITALS — BP 142/78 | HR 83 | Temp 97.7°F | Resp 18 | Ht 62.0 in | Wt 227.0 lb

## 2021-04-28 DIAGNOSIS — E66812 Obesity, class 2: Secondary | ICD-10-CM | POA: Insufficient documentation

## 2021-04-28 DIAGNOSIS — E785 Hyperlipidemia, unspecified: Secondary | ICD-10-CM | POA: Diagnosis not present

## 2021-04-28 DIAGNOSIS — E782 Mixed hyperlipidemia: Secondary | ICD-10-CM | POA: Diagnosis not present

## 2021-04-28 DIAGNOSIS — M170 Bilateral primary osteoarthritis of knee: Secondary | ICD-10-CM

## 2021-04-28 DIAGNOSIS — Z0000 Encounter for general adult medical examination without abnormal findings: Secondary | ICD-10-CM | POA: Diagnosis not present

## 2021-04-28 DIAGNOSIS — R7303 Prediabetes: Secondary | ICD-10-CM

## 2021-04-28 DIAGNOSIS — M1990 Unspecified osteoarthritis, unspecified site: Secondary | ICD-10-CM | POA: Insufficient documentation

## 2021-04-28 DIAGNOSIS — E6609 Other obesity due to excess calories: Secondary | ICD-10-CM | POA: Insufficient documentation

## 2021-04-28 NOTE — Patient Instructions (Signed)
Health Maintenance for Postmenopausal Women ?Menopause is a normal process in which your ability to get pregnant comes to an end. This process happens slowly over many months or years, usually between the ages of 48 and 55. Menopause is complete when you have missed your menstrual period for 12 months. ?It is important to talk with your health care provider about some of the most common conditions that affect women after menopause (postmenopausal women). These include heart disease, cancer, and bone loss (osteoporosis). Adopting a healthy lifestyle and getting preventive care can help to promote your health and wellness. The actions you take can also lower your chances of developing some of these common conditions. ?What are the signs and symptoms of menopause? ?During menopause, you may have the following symptoms: ?Hot flashes. These can be moderate or severe. ?Night sweats. ?Decrease in sex drive. ?Mood swings. ?Headaches. ?Tiredness (fatigue). ?Irritability. ?Memory problems. ?Problems falling asleep or staying asleep. ?Talk with your health care provider about treatment options for your symptoms. ?Do I need hormone replacement therapy? ?Hormone replacement therapy is effective in treating symptoms that are caused by menopause, such as hot flashes and night sweats. ?Hormone replacement carries certain risks, especially as you become older. If you are thinking about using estrogen or estrogen with progestin, discuss the benefits and risks with your health care provider. ?How can I reduce my risk for heart disease and stroke? ?The risk of heart disease, heart attack, and stroke increases as you age. One of the causes may be a change in the body's hormones during menopause. This can affect how your body uses dietary fats, triglycerides, and cholesterol. Heart attack and stroke are medical emergencies. There are many things that you can do to help prevent heart disease and stroke. ?Watch your blood pressure ?High  blood pressure causes heart disease and increases the risk of stroke. This is more likely to develop in people who have high blood pressure readings or are overweight. ?Have your blood pressure checked: ?Every 3-5 years if you are 18-39 years of age. ?Every year if you are 40 years old or older. ?Eat a healthy diet ? ?Eat a diet that includes plenty of vegetables, fruits, low-fat dairy products, and lean protein. ?Do not eat a lot of foods that are high in solid fats, added sugars, or sodium. ?Get regular exercise ?Get regular exercise. This is one of the most important things you can do for your health. Most adults should: ?Try to exercise for at least 150 minutes each week. The exercise should increase your heart rate and make you sweat (moderate-intensity exercise). ?Try to do strengthening exercises at least twice each week. Do these in addition to the moderate-intensity exercise. ?Spend less time sitting. Even light physical activity can be beneficial. ?Other tips ?Work with your health care provider to achieve or maintain a healthy weight. ?Do not use any products that contain nicotine or tobacco. These products include cigarettes, chewing tobacco, and vaping devices, such as e-cigarettes. If you need help quitting, ask your health care provider. ?Know your numbers. Ask your health care provider to check your cholesterol and your blood sugar (glucose). Continue to have your blood tested as directed by your health care provider. ?Do I need screening for cancer? ?Depending on your health history and family history, you may need to have cancer screenings at different stages of your life. This may include screening for: ?Breast cancer. ?Cervical cancer. ?Lung cancer. ?Colorectal cancer. ?What is my risk for osteoporosis? ?After menopause, you may be   at increased risk for osteoporosis. Osteoporosis is a condition in which bone destruction happens more quickly than new bone creation. To help prevent osteoporosis or  the bone fractures that can happen because of osteoporosis, you may take the following actions: ?If you are 19-50 years old, get at least 1,000 mg of calcium and at least 600 international units (IU) of vitamin D per day. ?If you are older than age 50 but younger than age 70, get at least 1,200 mg of calcium and at least 600 international units (IU) of vitamin D per day. ?If you are older than age 70, get at least 1,200 mg of calcium and at least 800 international units (IU) of vitamin D per day. ?Smoking and drinking excessive alcohol increase the risk of osteoporosis. Eat foods that are rich in calcium and vitamin D, and do weight-bearing exercises several times each week as directed by your health care provider. ?How does menopause affect my mental health? ?Depression may occur at any age, but it is more common as you become older. Common symptoms of depression include: ?Feeling depressed. ?Changes in sleep patterns. ?Changes in appetite or eating patterns. ?Feeling an overall lack of motivation or enjoyment of activities that you previously enjoyed. ?Frequent crying spells. ?Talk with your health care provider if you think that you are experiencing any of these symptoms. ?General instructions ?See your health care provider for regular wellness exams and vaccines. This may include: ?Scheduling regular health, dental, and eye exams. ?Getting and maintaining your vaccines. These include: ?Influenza vaccine. Get this vaccine each year before the flu season begins. ?Pneumonia vaccine. ?Shingles vaccine. ?Tetanus, diphtheria, and pertussis (Tdap) booster vaccine. ?Your health care provider may also recommend other immunizations. ?Tell your health care provider if you have ever been abused or do not feel safe at home. ?Summary ?Menopause is a normal process in which your ability to get pregnant comes to an end. ?This condition causes hot flashes, night sweats, decreased interest in sex, mood swings, headaches, or lack  of sleep. ?Treatment for this condition may include hormone replacement therapy. ?Take actions to keep yourself healthy, including exercising regularly, eating a healthy diet, watching your weight, and checking your blood pressure and blood sugar levels. ?Get screened for cancer and depression. Make sure that you are up to date with all your vaccines. ?This information is not intended to replace advice given to you by your health care provider. Make sure you discuss any questions you have with your health care provider. ?Document Revised: 10/21/2020 Document Reviewed: 10/21/2020 ?Elsevier Patient Education ? 2022 [employer] ? ?

## 2021-04-28 NOTE — Assessment & Plan Note (Signed)
Encouraged exercise for weight loss as this can help reduce joint pain Ok to take Tylenol OTC as needed

## 2021-04-28 NOTE — Progress Notes (Signed)
HPI:  Pt presents to the clinic today for her subsequent annual Medicare Wellness Exam.   Past Medical History:  Diagnosis Date   Arthritis    knees    Current Outpatient Medications  Medication Sig Dispense Refill   acetaminophen (TYLENOL) 500 MG tablet Take 500 mg by mouth every 6 (six) hours as needed.     Ascorbic Acid (VITAMIN C PO) Take by mouth daily.     B Complex Vitamins (VITAMIN B COMPLEX PO) Take by mouth daily.     Calcium Carb-Cholecalciferol (CALCIUM 1000 + D PO) Take by mouth.     CINNAMON PO Take by mouth daily.     ELDERBERRY PO Take by mouth.     Ginger, Zingiber officinalis, (GINGER ROOT) 550 MG CAPS Take by mouth.      Multiple Vitamin (MULTIVITAMIN) tablet Take 1 tablet by mouth daily.     TURMERIC PO Take by mouth daily.     No current facility-administered medications for this visit.    Allergies  Allergen Reactions   Cefdinir Swelling    "throat closes"   Erythromycin     Cousin is allergic   Hydromet [Hydrocodone Bit-Homatrop Mbr] Nausea And Vomiting    Family History  Problem Relation Age of Onset   Heart attack Father    Lung cancer Brother     Social History   Socioeconomic History   Marital status: Widowed    Spouse name: Not on file   Number of children: Not on file   Years of education: Not on file   Highest education level: Not on file  Occupational History   Occupation: retired   Tobacco Use   Smoking status: Former   Smokeless tobacco: Never   Tobacco comments:    socially in her 64s  Vaping Use   Vaping Use: Never used  Substance and Sexual Activity   Alcohol use: Not Currently   Drug use: Not Currently   Sexual activity: Not Currently  Other Topics Concern   Not on file  Social History Narrative   Not on file   Social Determinants of Health   Financial Resource Strain: Not on file  Food Insecurity: Not on file  Transportation Needs: Not on file  Physical Activity: Not on file  Stress: Not on file  Social  Connections: Not on file  Intimate Partner Violence: Not on file    Hospitiliaztions: None  Health Maintenance:    Flu: never  Tetanus: > 10 years ago  Pneumovax: never  Prevnar: never  Zostavax: never  Shingrix: never   Covid:Moderna x 4  Mammogram: 09/2020  Pap Smear: hysterectomy  Bone Density: 08/2019  Colon Screening: Cologuard 09/2019  Eye Doctor: annually  Dental Exam: biannually   Providers:   PCP: Webb Silversmith, NP  Ophthalmologist: Dr. Edison Pace  Gynecology: Dr. Kenton Kingfisher   I have personally reviewed and have noted:  1. The patient's medical and social history 2. Their use of alcohol, tobacco or illicit drugs 3. Their current medications and supplements 4. The patient's functional ability including ADL's, fall risks, home safety risks and hearing or visual impairment. 5. Diet and physical activities 6. Evidence for depression or mood disorder  Subjective:   Review of Systems:   Constitutional: Denies fever, malaise, fatigue, headache or abrupt weight changes.  HEENT: Denies eye pain, eye redness, ear pain, ringing in the ears, wax buildup, runny nose, nasal congestion, bloody nose, or sore throat. Respiratory: Denies difficulty breathing, shortness of breath, cough or sputum production.  Cardiovascular: Denies chest pain, chest tightness, palpitations or swelling in the hands or feet.  Gastrointestinal: Denies abdominal pain, bloating, constipation, diarrhea or blood in the stool.  GU: Denies urgency, frequency, pain with urination, burning sensation, blood in urine, odor or discharge. Musculoskeletal: Pt reports bilateral knee pain. Denies decrease in range of motion, difficulty with gait, muscle pain or joint swelling.  Skin: Denies redness, rashes, lesions or ulcercations.  Neurological: Denies dizziness, difficulty with memory, difficulty with speech or problems with balance and coordination.  Psych: Denies anxiety, depression, SI/HI.  No other specific  complaints in a complete review of systems (except as listed in HPI above).  Objective:  PE:  BP (!) 142/78   Pulse 83   Temp 97.7 F (36.5 C) (Temporal)   Resp 18   Ht 5\' 2"  (1.575 m)   Wt 227 lb (103 kg)   SpO2 100%   BMI 41.52 kg/m   Wt Readings from Last 3 Encounters:  10/24/20 228 lb 2 oz (103.5 kg)  04/25/20 222 lb 12.8 oz (101.1 kg)  04/24/20 221 lb (100.2 kg)    General: Appears her stated age, obese, in NAD. Skin: Warm, dry and intact.  HEENT: Head: normal shape and size; Eyes: sclera white, strabismus noted of right eye;  Neck: Neck supple, trachea midline. No masses, lumps or thyromegaly present.  Cardiovascular: Normal rate and rhythm. S1,S2 noted.  No murmur, rubs or gallops noted. No JVD or BLE edema. No carotid bruits noted. Pulmonary/Chest: Normal effort and positive vesicular breath sounds. No respiratory distress. No wheezes, rales or ronchi noted.  Abdomen: Soft and nontender. Normal bowel sounds.  Musculoskeletal: Strength 5/5 BUE/BLE. Gait slow and steady without device. Neurological: Alert and oriented. Cranial nerves II-XII grossly intact. Coordination normal.  Psychiatric: Mood and affect normal. Behavior is normal. Judgment and thought content normal.   BMET    Component Value Date/Time   NA 139 01/18/2020 1033   K 4.3 01/18/2020 1033   CL 105 01/18/2020 1033   CO2 28 01/18/2020 1033   GLUCOSE 123 (H) 01/18/2020 1033   BUN 19 01/18/2020 1033   CREATININE 0.75 01/18/2020 1033   CALCIUM 9.7 01/18/2020 1033   GFRNONAA 82 01/18/2020 1033   GFRAA 95 01/18/2020 1033    Lipid Panel     Component Value Date/Time   CHOL 254 (H) 04/24/2020 0919   TRIG 124 04/24/2020 0919   HDL 48 (L) 04/24/2020 0919   CHOLHDL 5.3 (H) 04/24/2020 0919   LDLCALC 180 (H) 04/24/2020 0919    CBC    Component Value Date/Time   WBC 8.0 01/18/2020 1033   RBC 4.31 01/18/2020 1033   HGB 13.1 01/18/2020 1033   HCT 39.5 01/18/2020 1033   PLT 315 01/18/2020 1033    MCV 91.6 01/18/2020 1033   MCH 30.4 01/18/2020 1033   MCHC 33.2 01/18/2020 1033   RDW 12.9 01/18/2020 1033   LYMPHSABS 2,240 01/18/2020 1033   EOSABS 152 01/18/2020 1033   BASOSABS 32 01/18/2020 1033    Hgb A1C Lab Results  Component Value Date   HGBA1C 5.7 (H) 01/18/2020      Assessment and Plan:   Medicare Annual Wellness Visit:  Diet:  She does eat meat. She consumes fruits and veggies. She does eat some fried foods. She drinks mostly water. Physical activity: Walking Depression/mood screen: Negative, PHQ 9 score of 0 Hearing: Intact to whispered voice Visual acuity: Grossly normal, performs annual eye exam  ADLs: Capable Fall risk: None Home safety:  Good Cognitive evaluation: Intact to orientation, naming, recall and repetition EOL planning: Adv directives, DNR/I agree  Preventative Medicine: She declines flu, tetanus, pneumovax, prevnar, zostovax or shingrix. Encouraged  her to get her covid booster. She no longer needs pap smears. Mammogram and bone density UTD. Colon screening UTD. Encouraged her to consume a balanced diet and exercise regimen. Advised her to see an eye doctor and dentist annually. Will check CBC, CMET, Lipid and A1C. Due dates for screening exams given to patient as part of her AVS.   Next appointment: 6 months follow up chronic conditions.   Webb Silversmith, NP This visit occurred during the SARS-CoV-2 public health emergency.  Safety protocols were in place, including screening questions prior to the visit, additional usage of staff PPE, and extensive cleaning of exam room while observing appropriate contact time as indicated for disinfecting solutions.

## 2021-04-28 NOTE — Assessment & Plan Note (Signed)
Encouraged diet and exercise for weight loss ?

## 2021-04-29 LAB — COMPLETE METABOLIC PANEL WITH GFR
AG Ratio: 1.4 (calc) (ref 1.0–2.5)
ALT: 17 U/L (ref 6–29)
AST: 16 U/L (ref 10–35)
Albumin: 4.3 g/dL (ref 3.6–5.1)
Alkaline phosphatase (APISO): 85 U/L (ref 37–153)
BUN: 20 mg/dL (ref 7–25)
CO2: 23 mmol/L (ref 20–32)
Calcium: 9.8 mg/dL (ref 8.6–10.4)
Chloride: 104 mmol/L (ref 98–110)
Creat: 0.78 mg/dL (ref 0.60–1.00)
Globulin: 3.1 g/dL (calc) (ref 1.9–3.7)
Glucose, Bld: 143 mg/dL — ABNORMAL HIGH (ref 65–99)
Potassium: 4.3 mmol/L (ref 3.5–5.3)
Sodium: 138 mmol/L (ref 135–146)
Total Bilirubin: 0.5 mg/dL (ref 0.2–1.2)
Total Protein: 7.4 g/dL (ref 6.1–8.1)
eGFR: 82 mL/min/{1.73_m2} (ref >=60)

## 2021-04-29 LAB — CBC
HCT: 39.9 % (ref 35.0–45.0)
Hemoglobin: 13.4 g/dL (ref 11.7–15.5)
MCH: 30 pg (ref 27.0–33.0)
MCHC: 33.6 g/dL (ref 32.0–36.0)
MCV: 89.3 fL (ref 80.0–100.0)
MPV: 10.8 fL (ref 7.5–12.5)
Platelets: 288 10*3/uL (ref 140–400)
RBC: 4.47 10*6/uL (ref 3.80–5.10)
RDW: 12.4 % (ref 11.0–15.0)
WBC: 7.4 10*3/uL (ref 3.8–10.8)

## 2021-04-29 LAB — LIPID PANEL
Cholesterol: 269 mg/dL — ABNORMAL HIGH (ref <200)
HDL: 56 mg/dL (ref >=50)
LDL Cholesterol (Calc): 192 mg/dL (calc) — ABNORMAL HIGH
Non-HDL Cholesterol (Calc): 213 mg/dL (calc) — ABNORMAL HIGH (ref <130)
Total CHOL/HDL Ratio: 4.8 (calc) (ref <5.0)
Triglycerides: 95 mg/dL (ref <150)

## 2021-04-29 LAB — HEMOGLOBIN A1C
Hgb A1c MFr Bld: 6.3 % of total Hgb — ABNORMAL HIGH (ref <5.7)
Mean Plasma Glucose: 134 mg/dL
eAG (mmol/L): 7.4 mmol/L

## 2021-04-30 ENCOUNTER — Telehealth: Payer: Self-pay

## 2021-04-30 NOTE — Telephone Encounter (Signed)
Copied from [place] [id number]. Topic: General - Other >> Apr 29, 2021 10:38 AM [facility] A wrote: Reason for CRM: The patient would like to speak with a member of clinical staff when possible   The patient was asked about a DNR and would like to know the reason that inquiry was made because they have never been asked about signing one prior to their appointment yesterday on 04/28/21  Please contact further when possible

## 2021-05-01 NOTE — Addendum Note (Signed)
Addended by: Jearld Fenton on: 05/01/2021 07:53 AM   Modules accepted: Orders

## 2021-08-14 ENCOUNTER — Other Ambulatory Visit: Payer: Self-pay | Admitting: Internal Medicine

## 2021-08-14 DIAGNOSIS — Z1231 Encounter for screening mammogram for malignant neoplasm of breast: Secondary | ICD-10-CM

## 2021-09-22 ENCOUNTER — Ambulatory Visit
Admission: RE | Admit: 2021-09-22 | Discharge: 2021-09-22 | Disposition: A | Discharge status: Home / Self Care | Payer: PPO | Source: Ambulatory Visit | Attending: Internal Medicine | Admitting: Internal Medicine

## 2021-09-22 DIAGNOSIS — Z1231 Encounter for screening mammogram for malignant neoplasm of breast: Secondary | ICD-10-CM | POA: Insufficient documentation

## 2021-10-28 ENCOUNTER — Encounter: Payer: Self-pay | Admitting: Internal Medicine

## 2021-10-28 ENCOUNTER — Ambulatory Visit (INDEPENDENT_AMBULATORY_CARE_PROVIDER_SITE_OTHER): Payer: PPO | Admitting: Internal Medicine

## 2021-10-28 VITALS — BP 136/84 | HR 85 | Temp 97.3°F | Wt 232.0 lb

## 2021-10-28 DIAGNOSIS — I10 Essential (primary) hypertension: Secondary | ICD-10-CM | POA: Diagnosis not present

## 2021-10-28 DIAGNOSIS — R7303 Prediabetes: Secondary | ICD-10-CM

## 2021-10-28 DIAGNOSIS — M170 Bilateral primary osteoarthritis of knee: Secondary | ICD-10-CM

## 2021-10-28 DIAGNOSIS — E782 Mixed hyperlipidemia: Secondary | ICD-10-CM

## 2021-10-28 DIAGNOSIS — M810 Age-related osteoporosis without current pathological fracture: Secondary | ICD-10-CM

## 2021-10-28 NOTE — Assessment & Plan Note (Signed)
Encouraged DASH diet and exercise for weight loss ?We will monitor ?

## 2021-10-28 NOTE — Assessment & Plan Note (Signed)
Encourage diet and exercise for weight loss 

## 2021-10-28 NOTE — Assessment & Plan Note (Signed)
Encourage daily weightbearing exercise ?Continue Calcium and Vitamin D OTC ?

## 2021-10-28 NOTE — Assessment & Plan Note (Signed)
Lipid profile today ?Encouraged her to consume a low-fat diet ?Discussed the use of statin therapy, but she would like to work on diet ?Discussed the risk of heart attack, stroke and death with uncontrolled lipids ?

## 2021-10-28 NOTE — Progress Notes (Signed)
? ?Subjective:  ? ? Patient ID: Haley Morris, female    DOB: 19-Apr-1951, 71 y.o.   MRN: [id number] ? ?HPI ? ?Patient presents to clinic today for follow-up of chronic conditions. ? ?OA: Mainly in her back and knees, L >R.  She takes Tylenol and Turmeric OTC with some relief of symptoms. ? ?HTN: Her BP today is 136/84.  She reports her BP at home runs 120/70's.  She is not currently taking any antihypertensive medications at this time.  There is no ECG on file. ? ?HLD: Her last LDL was 192, triglycerides 95, 04/2021.  She is not taking any cholesterol-lowering medication at this time.  She does not consume a low-fat diet. ? ?Prediabetes: Her last A1c was 6.3%, 04/2021.  She is taking Cinnamon OTC but is not taking any prescription oral diabetic medication at this time.  She does not check her sugars. ? ?Osteoporosis: She is taking Calcium and Vitamin D OTC.  She does not get daily weightbearing exercise.  Bone density from 08/2019 reviewed. ? ?Review of Systems ? ?Past Medical History:  ?Diagnosis Date  ? Arthritis   ? knees  ? ? ?Current Outpatient Medications  ?Medication Sig Dispense Refill  ? acetaminophen (TYLENOL) 500 MG tablet Take 500 mg by mouth every 6 (six) hours as needed.    ? Ascorbic Acid (VITAMIN C PO) Take by mouth daily.    ? B Complex Vitamins (VITAMIN B COMPLEX PO) Take by mouth daily.    ? Calcium Carb-Cholecalciferol (CALCIUM 1000 + D PO) Take by mouth.    ? CINNAMON PO Take by mouth daily.    ? ELDERBERRY PO Take by mouth.    ? Ginger, Zingiber officinalis, (GINGER ROOT) 550 MG CAPS Take by mouth.     ? Multiple Vitamin (MULTIVITAMIN) tablet Take 1 tablet by mouth daily.    ? TURMERIC PO Take by mouth daily.    ? [place] 5-2.5-18.5 LF-MCG/0.5 injection     ? MODERNA COVID-19 VACCINE 100 MCG/0.5ML injection     ? ?No current facility-administered medications for this visit.  ? ? ?Allergies  ?Allergen Reactions  ? Cefdinir Swelling  ?  "throat closes"  ? Erythromycin   ?  Cousin is allergic  ?  Hydromet [Hydrocodone Bit-Homatrop Mbr] Nausea And Vomiting  ? ? ?Family History  ?Problem Relation Age of Onset  ? Heart attack Father   ? Lung cancer Brother   ? ? ?Social History  ? ?Socioeconomic History  ? Marital status: Widowed  ?  Spouse name: Not on file  ? Number of children: Not on file  ? Years of education: Not on file  ? Highest education level: Not on file  ?Occupational History  ? Occupation: retired   ?Tobacco Use  ? Smoking status: Former  ? Smokeless tobacco: Never  ? Tobacco comments:  ?  socially in her 12s  ?Vaping Use  ? Vaping Use: Never used  ?Substance and Sexual Activity  ? Alcohol use: Not Currently  ? Drug use: Not Currently  ? Sexual activity: Not Currently  ?Other Topics Concern  ? Not on file  ?Social History Narrative  ? Not on file  ? ?Social Determinants of Health  ? ?Financial Resource Strain: Not on file  ?Food Insecurity: Not on file  ?Transportation Needs: Not on file  ?Physical Activity: Not on file  ?Stress: Not on file  ?Social Connections: Not on file  ?Intimate Partner Violence: Not on file  ? ? ? ?Constitutional: Denies  fever, malaise, fatigue, headache or abrupt weight changes.  ?HEENT: Denies eye pain, eye redness, ear pain, ringing in the ears, wax buildup, runny nose, nasal congestion, bloody nose, or sore throat. ?Respiratory: Denies difficulty breathing, shortness of breath, cough or sputum production.   ?Cardiovascular: Denies chest pain, chest tightness, palpitations or swelling in the hands or feet.  ?Gastrointestinal: Denies abdominal pain, bloating, constipation, diarrhea or blood in the stool.  ?GU: Denies urgency, frequency, pain with urination, burning sensation, blood in urine, odor or discharge. ?Musculoskeletal: Pt reports joint pain. Denies decrease in range of motion, difficulty with gait, muscle pain or joint swelling.  ?Skin: Denies redness, rashes, lesions or ulcercations.  ?Neurological: Denies dizziness, difficulty with memory, difficulty with  speech or problems with balance and coordination.  ?Psych: Denies anxiety, depression, SI/HI. ? ?No other specific complaints in a complete review of systems (except as listed in HPI above). ? ?   ?Objective:  ? Physical Exam ? ?BP 136/84 (BP Location: Left Arm, Patient Position: Sitting, Cuff Size: Large)   Pulse 85   Temp (!) 97.3 ?F (36.3 ?C) (Temporal)   Wt 232 lb (105.2 kg)   SpO2 99%   BMI 42.43 kg/m?  ?Wt Readings from Last 3 Encounters:  ?10/28/21 232 lb (105.2 kg)  ?04/28/21 227 lb (103 kg)  ?10/24/20 228 lb 2 oz (103.5 kg)  ? ? ?General: Appears her stated age, obese, in NAD. ?Skin: Warm, dry and intact.  ?HEENT: Head: normal shape and size; Eyes: sclera white, no icterus, conjunctiva pink, PERRLA and EOMs intact; ?Cardiovascular: Normal rate and rhythm. S1,S2 noted.  No murmur, rubs or gallops noted. No JVD or BLE edema. No carotid bruits noted. ?Pulmonary/Chest: Normal effort and positive vesicular breath sounds. No respiratory distress. No wheezes, rales or ronchi noted.  ?Musculoskeletal: She has noted difficulty getting from a sitting to a standing position.  She is unable to get on the exam table today.  No difficulty with gait.  ?Neurological: Alert and oriented. . Coordination normal.  ? ?BMET ?   ?Component Value Date/Time  ? NA 138 04/28/2021 1002  ? K 4.3 04/28/2021 1002  ? CL 104 04/28/2021 1002  ? CO2 23 04/28/2021 1002  ? GLUCOSE 143 (H) 04/28/2021 1002  ? BUN 20 04/28/2021 1002  ? CREATININE 0.78 04/28/2021 1002  ? CALCIUM 9.8 04/28/2021 1002  ? GFRNONAA 82 01/18/2020 1033  ? GFRAA 95 01/18/2020 1033  ? ? ?Lipid Panel  ?   ?Component Value Date/Time  ? CHOL 269 (H) 04/28/2021 1002  ? TRIG 95 04/28/2021 1002  ? HDL 56 04/28/2021 1002  ? CHOLHDL 4.8 04/28/2021 1002  ? LDLCALC 192 (H) 04/28/2021 1002  ? ? ?CBC ?   ?Component Value Date/Time  ? WBC 7.4 04/28/2021 1002  ? RBC 4.47 04/28/2021 1002  ? HGB 13.4 04/28/2021 1002  ? HCT 39.9 04/28/2021 1002  ? PLT 288 04/28/2021 1002  ? MCV 89.3  04/28/2021 1002  ? MCH 30.0 04/28/2021 1002  ? MCHC 33.6 04/28/2021 1002  ? RDW 12.4 04/28/2021 1002  ? LYMPHSABS 2,240 01/18/2020 1033  ? EOSABS 152 01/18/2020 1033  ? BASOSABS 32 01/18/2020 1033  ? ? ?Hgb A1C ?Lab Results  ?Component Value Date  ? HGBA1C 6.3 (H) 04/28/2021  ? ? ? ? ? ? ? ?   ?Assessment & Plan:  ? ?Webb Silversmith, NP ? ?

## 2021-10-28 NOTE — Assessment & Plan Note (Signed)
Encourage weight loss as this can help reduce joint pain ?Continue Tylenol and Turmeric OTC ?She is not interested in x-rays or referral to orthopedic at this time ?

## 2021-10-28 NOTE — Assessment & Plan Note (Signed)
A1c today ?Encouraged her to consume a low carb diet and exercise for weight loss ?Discussed the risk of heart attack, stroke and death given her history of elevated glucose, uncontrolled HLD and HTN ?

## 2021-10-29 LAB — COMPLETE METABOLIC PANEL WITH GFR
AG Ratio: 1.3 (calc) (ref 1.0–2.5)
ALT: 23 U/L (ref 6–29)
AST: 20 U/L (ref 10–35)
Albumin: 4.3 g/dL (ref 3.6–5.1)
Alkaline phosphatase (APISO): 80 U/L (ref 37–153)
BUN: 19 mg/dL (ref 7–25)
CO2: 23 mmol/L (ref 20–32)
Calcium: 9.8 mg/dL (ref 8.6–10.4)
Chloride: 105 mmol/L (ref 98–110)
Creat: 0.82 mg/dL (ref 0.60–1.00)
Globulin: 3.2 g/dL (calc) (ref 1.9–3.7)
Glucose, Bld: 136 mg/dL — ABNORMAL HIGH (ref 65–99)
Potassium: 4.3 mmol/L (ref 3.5–5.3)
Sodium: 139 mmol/L (ref 135–146)
Total Bilirubin: 0.5 mg/dL (ref 0.2–1.2)
Total Protein: 7.5 g/dL (ref 6.1–8.1)
eGFR: 77 mL/min/{1.73_m2} (ref >=60)

## 2021-10-29 LAB — HEMOGLOBIN A1C
Hgb A1c MFr Bld: 6.4 % of total Hgb — ABNORMAL HIGH (ref <5.7)
Mean Plasma Glucose: 137 mg/dL
eAG (mmol/L): 7.6 mmol/L

## 2021-11-13 IMAGING — US US THYROID
1 series · 14 of 25 positions shown · non-contrast
Comparison: None.

CLINICAL DATA: Other.  Elevated TSH.

EXAM:
THYROID ULTRASOUND
TECHNIQUE: Ultrasound examination of the thyroid gland and adjacent soft
tissues was performed.

[Series 1: us thyroid · 0.07mm/px · 14 of 31 slices shown]
[im 1/31]
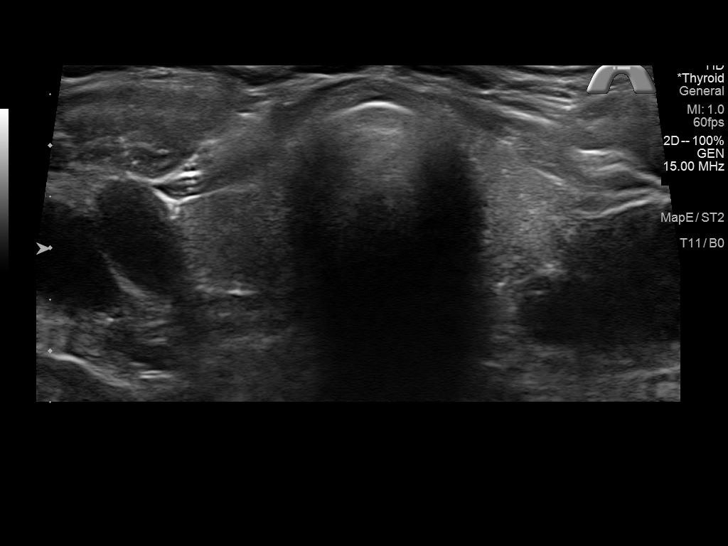
[im 3/31]
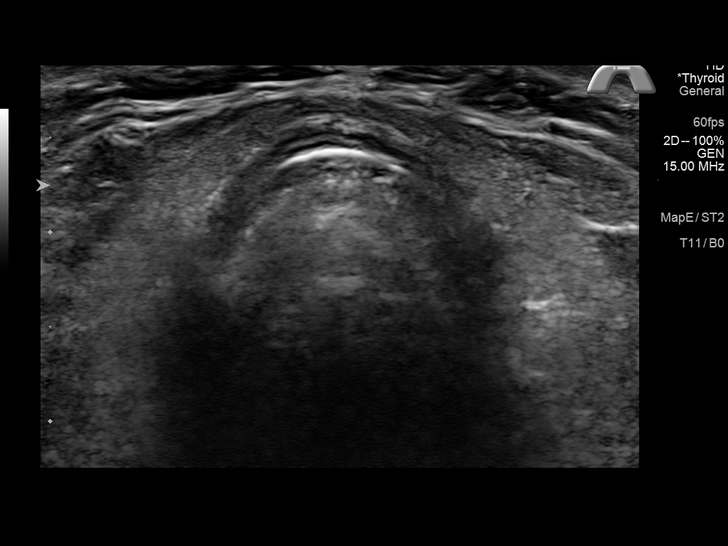
[im 6/31]
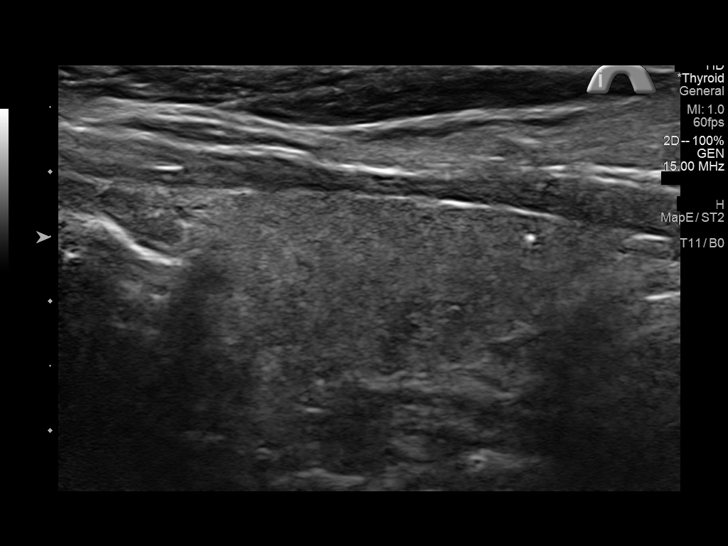
[im 8/31]
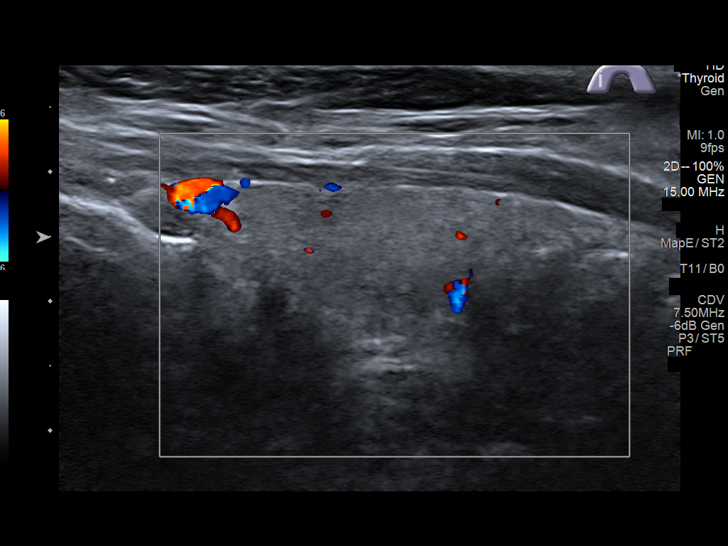
[im 11/31]
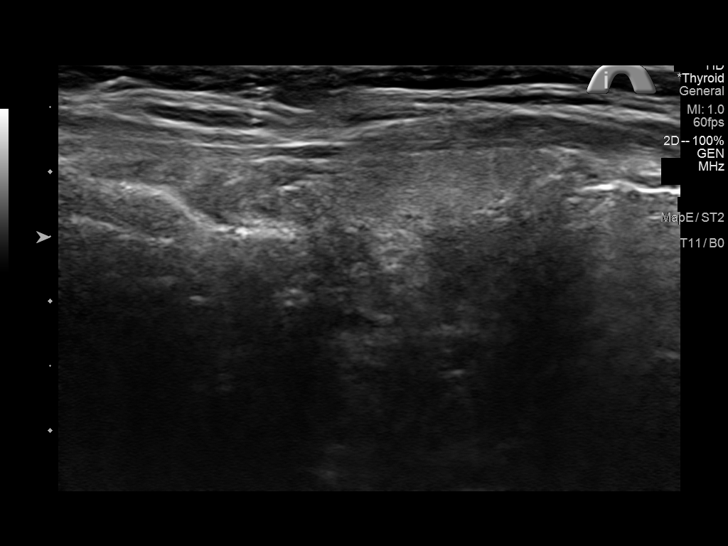
[im 12/31]
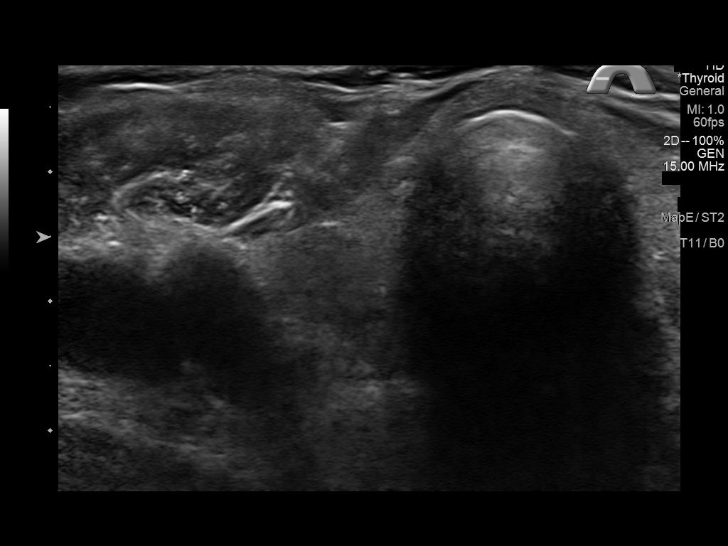
[im 14/31]
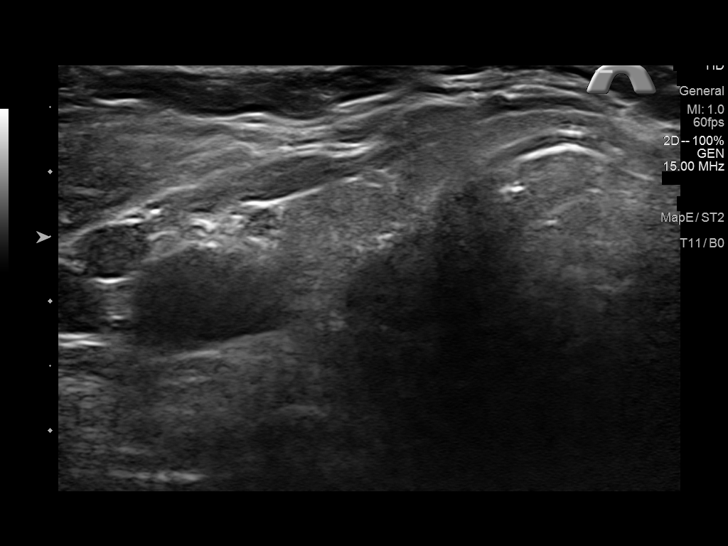
[im 17/31]
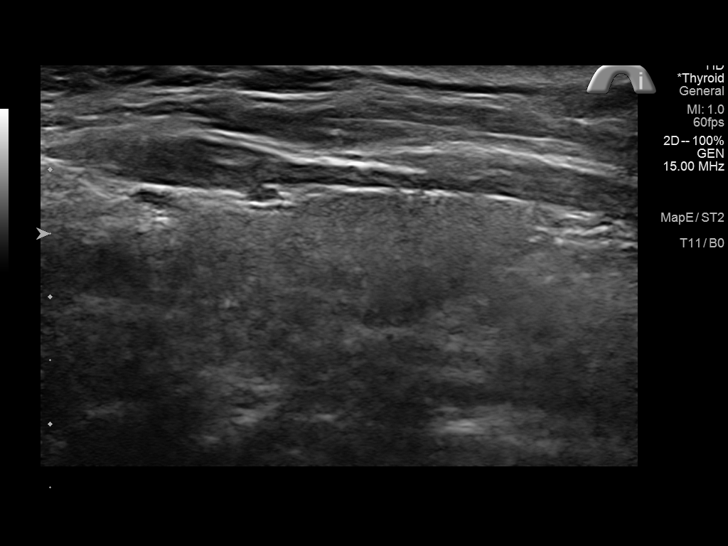
[im 19/31]
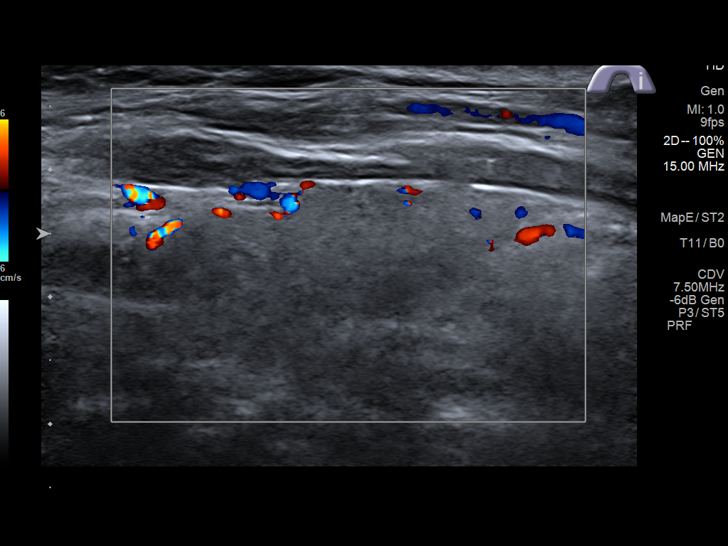
[im 21/31]
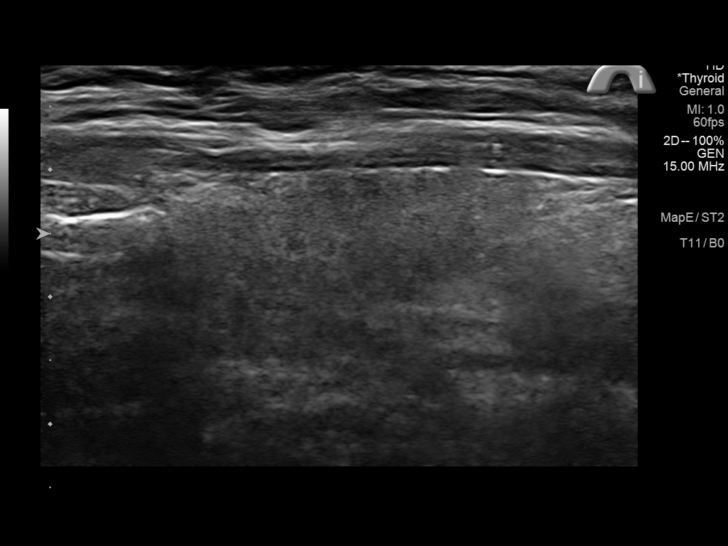
[im 23/31]
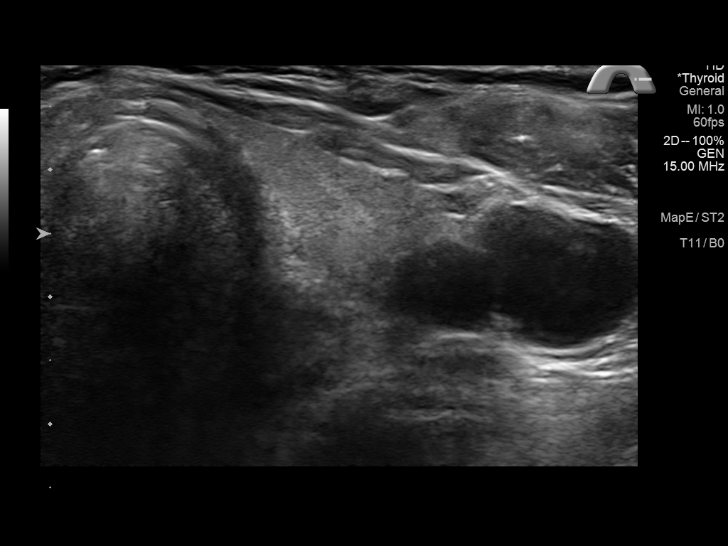
[im 26/31]
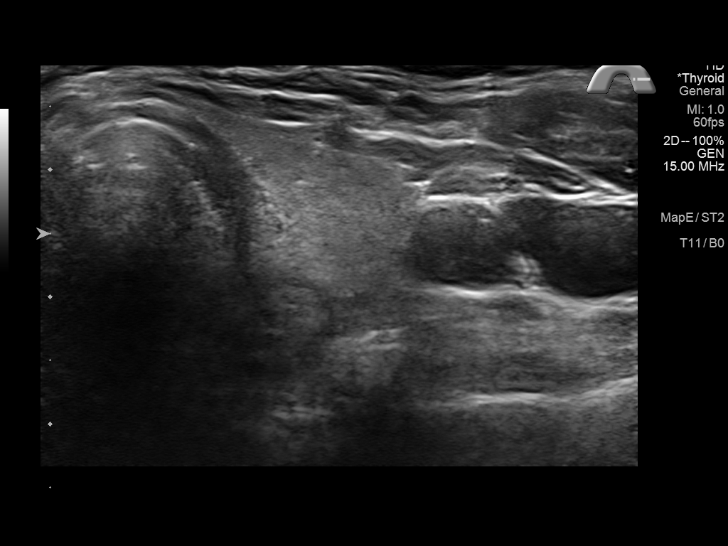
[im 28/31]
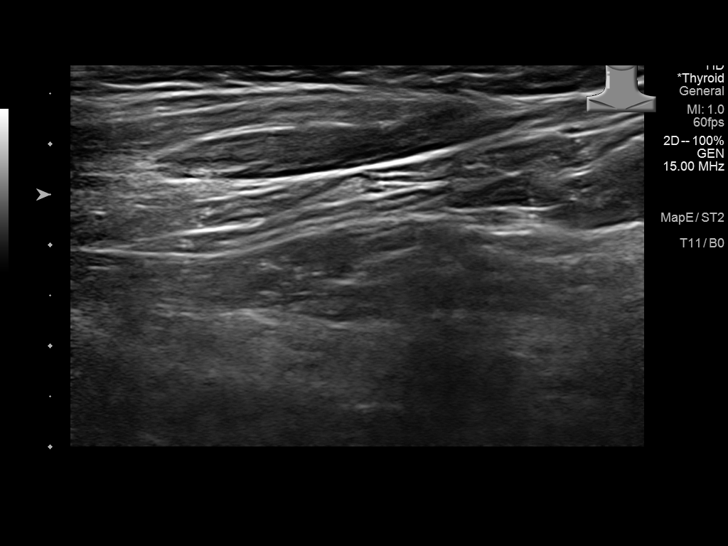
[im 31/31]
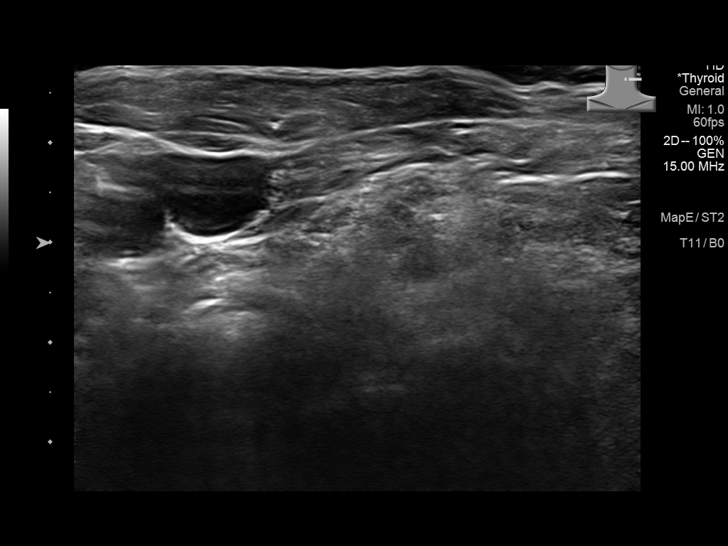

[14 of 25 positions shown; findings below may reference images not displayed]

FINDINGS: Parenchymal Echotexture: Mildly heterogenous

Isthmus: Normal in size measures 0.2 cm in diameter

Right lobe: Slightly atrophic measuring 3.4 x 1.4 x 1.2 cm

Left lobe: Slightly atrophic measuring 3.0 x 1.1 x 1.5 cm

_________________________________________________________

Estimated total number of nodules >/= 1 cm: 0

Number of spongiform nodules >/=  2 cm not described below (TR1): 0

Number of mixed cystic and solid nodules >/= 1.5 cm not described
below (TR2): 0

_________________________________________________________

No discrete nodules are seen within the thyroid gland.
IMPRESSION: Slightly atrophic and mildly heterogeneous appearing thyroid gland
without discrete nodule or mass.

## 2021-12-11 ENCOUNTER — Ambulatory Visit: Payer: Self-pay | Admitting: *Deleted

## 2021-12-11 NOTE — Telephone Encounter (Signed)
Reason for Disposition  [1] Caller has URGENT question AND [2] triager unable to answer question  Answer Assessment - Initial Assessment Questions 1. MECHANISM: "How did the fall happen?"     I fell Friday and went to ED.   I have oxycodone for pain.   The nurse from insurance company told me to call my PCP for more pain medicine before I ran out.     She has 5 left.   I take one every 4 every hrs for pain  I have a brace on my neck to my waist. No triage done since been to ED and just requesting more of her pain medication the oxycodone. 2. DOMESTIC VIOLENCE AND ELDER ABUSE SCREENING: "Did you fall because someone pushed you or tried to hurt you?" If Yes, ask: "Are you safe now?"     N/A 3. ONSET: "When did the fall happen?" (e.g., minutes, hours, or days ago)     [place] last Friday and went to the ED. 4. LOCATION: "What part of the body hit the ground?" (e.g., back, buttocks, head, hips, knees, hands, head, stomach)     Has a brace on from neck to her waist. 5. INJURY: "Did you hurt (injure) yourself when you fell?" If Yes, ask: "What did you injure? Tell me more about this?" (e.g., body area; type of injury; pain severity)"     She did not go into detail of what happened. 6. PAIN: "Is there any pain?" If Yes, ask: "How bad is the pain?" (e.g., Scale 1-10; or mild,  moderate, severe)   - NONE (0): No pain   - MILD (1-3): Doesn't interfere with normal activities    - MODERATE (4-7): Interferes with normal activities or awakens from sleep    - SEVERE (8-10): Excruciating pain, unable to do any normal activities      Moderate pain reason needing the oxycodone. 7. SIZE: For cuts, bruises, or swelling, ask: "How large is it?" (e.g., inches or centimeters)      N/A 8. PREGNANCY: "Is there any chance you are pregnant?" "When was your last menstrual period?"     N/A 9. OTHER SYMPTOMS: "Do you have any other symptoms?" (e.g., dizziness, fever, weakness; new onset or worsening).      N/A 10.  CAUSE: "What do you think caused the fall (or falling)?" (e.g., tripped, dizzy spell)       Not asked  Protocols used: Falls and [facility]  Chief Complaint: Pt fell last Friday and has been to the ED.   Needs a refill of the Oxycodone they gave her for pain.   Has 5 pills left. Symptoms: Pain from her fall. Frequency: N/A Pertinent Negatives: Patient denies N/A Disposition: '[]'$ ED /'[]'$ Urgent Care (no appt availability in office) / '[]'$ Appointment(In office/virtual)/ '[]'$  [place]/ '[]'$ Home Care/ '[]'$ Refused Recommended Disposition /'[]'$ [facility]/ '[x]'$  Follow-up with PCP Additional Notes: I have sent the request to [facility].   She wants it sent to [facility] in [place].

## 2021-12-12 ENCOUNTER — Ambulatory Visit (INDEPENDENT_AMBULATORY_CARE_PROVIDER_SITE_OTHER): Payer: PPO | Admitting: Internal Medicine

## 2021-12-12 ENCOUNTER — Encounter: Payer: Self-pay | Admitting: Internal Medicine

## 2021-12-12 VITALS — BP 144/82 | HR 88 | Temp 97.1°F | Wt 227.0 lb

## 2021-12-12 DIAGNOSIS — S32001D Stable burst fracture of unspecified lumbar vertebra, subsequent encounter for fracture with routine healing: Secondary | ICD-10-CM

## 2021-12-12 MED ORDER — OXYCODONE HCL 5 MG PO TABS: 5.0000 mg | ORAL_TABLET | Freq: Four times a day (QID) | ORAL | 0 refills | Status: DC | PRN

## 2021-12-12 NOTE — Patient Instructions (Signed)
Spinal Compression Fracture  A spinal compression fracture is a collapse of the bones that form the spine (vertebrae). With this type of fracture, the vertebrae become pushed (compressed) into a wedge shape. Most compression fractures happen in the middle or lower part of the spine. What are the causes? This condition may be caused by: Thinning and loss of density in the bones (osteoporosis). This is the most common cause. A fall. A car or motorcycle accident. Cancer. Trauma, such as a heavy, direct hit to the head or back. What increases the risk? You are more likely to develop this condition if: You are 24 years of age or older. You have osteoporosis. You have certain types of cancer, including: Multiple myeloma. Lymphoma. Prostate cancer. Lung cancer. Breast cancer. What are the signs or symptoms? Symptoms of this condition include: Severe pain with simple movements such as coughing or sneezing. Pain that gets worse over time. Pain that is worse when you stand, walk, sit, or bend. Sudden pain that is so bad that it is hard for you to move. Bending or humping of the spine. Gradual loss of height. Numbness, tingling, or weakness in the back and legs. Trouble walking. Your symptoms will depend on the cause of the fracture and how quickly it develops. How is this diagnosed? This condition may be diagnosed based on symptoms, medical history, and a physical exam. During the physical exam, your health care provider may tap along the length of your spine to check for tenderness. Tests may be done to confirm the diagnosis. They may include: A bone mineral density test to check for osteoporosis. Imaging tests, such as a spine X-ray, CT scan, or MRI. How is this treated? Treatment depends on the cause and severity of the condition. Some fractures may heal on their own with supportive care. Treatment may include: Pain medicine. Rest. A back brace. Physical therapy exercises. Medicine  to strengthen bone. Calcium and vitamin D supplements. Fractures that cause the back to become misshapen, cause nerve pain or weakness, or do not respond to other treatment may be treated with surgery. This may include: Vertebroplasty. Bone cement is injected into the collapsed vertebrae to stabilize them. Balloon kyphoplasty. The collapsed vertebrae are expanded with a balloon and then bone cement is injected into them. Spinal fusion. The collapsed vertebrae are connected (fused) to normal vertebrae. Follow these instructions at home: Medicines Take over-the-counter and prescription medicines only as told by your health care provider. Ask your health care provider if the medicine prescribed to you: Requires you to avoid driving or using machinery. Can cause constipation. You may need to take these actions to prevent or treat constipation: Drink enough fluid to keep your urine pale yellow. Take over-the-counter or prescription medicines. Eat foods that are high in fiber, such as beans, whole grains, and fresh fruits and vegetables. Limit foods that are high in fat and processed sugars, such as fried or sweet foods. If you have a brace: Wear the brace as told by your health care provider. Remove it only as told by your health care provider. Loosen the brace if your fingers or toes tingle, become numb, or turn cold and blue. Keep the brace clean. If the brace is not waterproof: Do not let it get wet. Cover it with a watertight covering when you take a bath or a shower. Managing pain, stiffness, and swelling  If directed, put ice on the injured area. To do this: If you have a removable brace, remove it as  told by your health care provider. Put ice in a plastic bag. Place a towel between your skin and the bag. Leave the ice on for 20 minutes, 2-3 times a day. Remove the ice if your skin turns bright red. This is very important. If you cannot feel pain, heat, or cold, you have a greater risk  of damage to the area. Activity Rest as told by your health care provider. Avoid sitting for a long time without moving. Get up to take short walks every 1-2 hours. This is important to improve blood flow and breathing. Ask for help if you feel weak or unsteady. Return to your normal activities as told by your health care provider. Ask what activities are safe for you. Do physical therapy exercises to improve movement and strength in your back, as recommended by your health care provider. Exercise regularly as directed by your health care provider. General instructions  Do not drink alcohol. Alcohol can interfere with your treatment. Do not use any products that contain nicotine or tobacco, such as cigarettes, e-cigarettes, and chewing tobacco. These can delay bone healing. If you need help quitting, ask your health care provider. Keep all follow-up visits. This is important. It can help to prevent permanent injury, disability, and long-lasting (chronic) pain. Contact a health care provider if: You have a fever. Your pain medicine is not helping. Your pain does not get better over time. You cannot return to your normal activities as planned or expected. Get help right away if: Your pain is very bad and it suddenly gets worse. You are unable to move any body part (paralysis) that is below the level of your injury. You have numbness, tingling, or weakness in any body part that is below the level of your injury. You cannot control your bladder or bowels. Summary A spinal compression fracture is a collapse of the bones that form the spine (vertebrae). With this type of fracture, the vertebrae become pushed (compressed) into a wedge shape. Your symptoms and treatment will depend on the cause and severity of the fracture and how quickly it develops. Some fractures may heal on their own with supportive care. Fractures that cause the back to become misshapen, cause nerve pain or weakness, or do not  respond to other treatment may be treated with surgery. This information is not intended to replace advice given to you by your health care provider. Make sure you discuss any questions you have with your health care provider. Document Revised: 09/20/2019 Document Reviewed: 09/20/2019 Elsevier Patient Education  [place].

## 2021-12-12 NOTE — Telephone Encounter (Signed)
Pt has apt today (12/12/2021)   Thanks,   -Mickel Baas

## 2021-12-12 NOTE — Progress Notes (Signed)
Subjective:    Patient ID: Haley Morris, female    DOB: Mar 13, 1951, 71 y.o.   MRN: [id number]  HPI  Patient presents to the clinic today for ER follow-up.  She presented to [facility] ER 6/26 status post a fall that occurred on 6/23. She was initially seen at [facility] urgent care. Xray was done and she was advised to go to the ER.  X-ray/CT lumbar spine showed a new L1 burst compression fracture with 35% disc height loss and 5 mm retropulsion bulge in of the posterior superior endplate.  It showed chronic T12, L3 and L5 compression fractures.  She was placed in TLSO brace and given Oxycodone to take as needed for severe pain.  She was discharged and advised to follow-up with neurosurgery in 6 weeks.  Since discharge, she reports her pain is not that bad. She has been wearing the brace. She has been taking Tylenol and Ibuprofen. She would like a refill of Oxycodone in case the pain gets bad again.  Review of Systems     Past Medical History:  Diagnosis Date   Arthritis    knees    Current Outpatient Medications  Medication Sig Dispense Refill   acetaminophen (TYLENOL) 500 MG tablet Take 500 mg by mouth every 6 (six) hours as needed.     Ascorbic Acid (VITAMIN C PO) Take by mouth daily.     B Complex Vitamins (VITAMIN B COMPLEX PO) Take by mouth daily.     BOOSTRIX 5-2.5-18.5 LF-MCG/0.5 injection      Calcium Carb-Cholecalciferol (CALCIUM 1000 + D PO) Take by mouth.     CINNAMON PO Take by mouth daily.     ELDERBERRY PO Take by mouth.     Ginger, Zingiber officinalis, (GINGER ROOT) 550 MG CAPS Take by mouth.      MODERNA COVID-19 VACCINE 100 MCG/0.5ML injection      Multiple Vitamin (MULTIVITAMIN) tablet Take 1 tablet by mouth daily.     TURMERIC PO Take by mouth daily.     No current facility-administered medications for this visit.    Allergies  Allergen Reactions   Cefdinir Swelling    "throat closes"   Erythromycin     Cousin is allergic   Hydromet [Hydrocodone  Bit-Homatrop Mbr] Nausea And Vomiting    Family History  Problem Relation Age of Onset   Heart attack Father    Lung cancer Brother     Social History   Socioeconomic History   Marital status: Widowed    Spouse name: Not on file   Number of children: Not on file   Years of education: Not on file   Highest education level: Not on file  Occupational History   Occupation: retired   Tobacco Use   Smoking status: Former   Smokeless tobacco: Never   Tobacco comments:    socially in her 79s  Vaping Use   Vaping Use: Never used  Substance and Sexual Activity   Alcohol use: Not Currently   Drug use: Not Currently   Sexual activity: Not Currently  Other Topics Concern   Not on file  Social History Narrative   Not on file   Social Determinants of Health   Financial Resource Strain: Low Risk  (09/26/2019)   Overall Financial Resource Strain (CARDIA)    Difficulty of Paying Living Expenses: Not hard at all  Food Insecurity: No Food Insecurity (09/26/2019)   Hunger Vital Sign    Worried About Charity fundraiser in  the Last Year: Never true    [place] in the Last Year: Never true  Transportation Needs: No Transportation Needs (09/26/2019)   PRAPARE - Hydrologist (Medical): No    Lack of Transportation (Non-Medical): No  Physical Activity: Not on file  Stress: Not on file  Social Connections: Moderately Isolated (09/26/2019)   Social Connection and Isolation Panel [NHANES]    Frequency of Communication with Friends and Family: More than three times a week    Frequency of Social Gatherings with Friends and Family: More than three times a week    Attends Religious Services: More than 4 times per year    Active Member of [employer] or Organizations: No    Attends Archivist Meetings: Never    Marital Status: Widowed  Intimate Partner Violence: Not on file     Constitutional: Denies fever, malaise, fatigue, headache or abrupt weight  changes.  Respiratory: Denies difficulty breathing, shortness of breath, cough or sputum production.   Cardiovascular: Denies chest pain, chest tightness, palpitations or swelling in the hands or feet.  Gastrointestinal: Denies loss of bowel control.  GU: Denies loss of bladder control. Musculoskeletal: Patient reports acute low back pai.  Denies decrease in range of motion, difficulty with gait, muscle pain or joint swelling.  Skin: Denies redness, rashes, lesions or ulcercations.  Neurological: Denies numbness, tingling, weakness or problems with balance and coordination.    No other specific complaints in a complete review of systems (except as listed in HPI above).  Objective:   Physical Exam BP (!) 144/82 (BP Location: Left Wrist, Patient Position: Sitting, Cuff Size: Normal)   Pulse 88   Temp (!) 97.1 F (36.2 C) (Tympanic)   Wt 227 lb (103 kg)   SpO2 96%   BMI 41.52 kg/m   Wt Readings from Last 3 Encounters:  10/28/21 232 lb (105.2 kg)  04/28/21 227 lb (103 kg)  10/24/20 228 lb 2 oz (103.5 kg)    General: Appears her stated age, obese, chronically ill-appearing, in NAD. Neck:  Neck supple, trachea midline. No masses, lumps or thyromegaly present.  Cardiovascular: Normal rate. Pulmonary/Chest: Normal effort and positive vesicular breath sounds. No respiratory distress. No wheezes, rales or ronchi noted.  Musculoskeletal: Wearing TLSO brace.  No difficulty with gait.  Neurological: Alert and oriented.  Sensation intact to BLE.  BMET    Component Value Date/Time   NA 139 10/28/2021 0812   K 4.3 10/28/2021 0812   CL 105 10/28/2021 0812   CO2 23 10/28/2021 0812   GLUCOSE 136 (H) 10/28/2021 0812   BUN 19 10/28/2021 0812   CREATININE 0.82 10/28/2021 0812   CALCIUM 9.8 10/28/2021 0812   GFRNONAA 82 01/18/2020 1033   GFRAA 95 01/18/2020 1033    Lipid Panel     Component Value Date/Time   CHOL 269 (H) 04/28/2021 1002   TRIG 95 04/28/2021 1002   HDL 56 04/28/2021  1002   CHOLHDL 4.8 04/28/2021 1002   LDLCALC 192 (H) 04/28/2021 1002    CBC    Component Value Date/Time   WBC 7.4 04/28/2021 1002   RBC 4.47 04/28/2021 1002   HGB 13.4 04/28/2021 1002   HCT 39.9 04/28/2021 1002   PLT 288 04/28/2021 1002   MCV 89.3 04/28/2021 1002   MCH 30.0 04/28/2021 1002   MCHC 33.6 04/28/2021 1002   RDW 12.4 04/28/2021 1002   LYMPHSABS 2,240 01/18/2020 1033   EOSABS 152 01/18/2020 1033  BASOSABS 32 01/18/2020 1033    Hgb A1C Lab Results  Component Value Date   HGBA1C 6.4 (H) 10/28/2021           Assessment & Plan:   ER follow-up for L1 Burst Fracture, Multiple Chronic Compression Fractures:  ER notes, labs and imaging reviewed Encouraged her to wear the TLSO brace during the day, off at night Advised her to take Tylenol and Ibuprofen prior to taking oxycodone which was refilled today She will follow-up with neurosurgery as planned  RTC in 4 months for your annual exam Webb Silversmith, NP

## 2021-12-12 NOTE — Telephone Encounter (Signed)
I am not sending in narcotic pain medication.  She needs an ER follow-up.

## 2022-05-04 ENCOUNTER — Ambulatory Visit: Payer: PPO | Admitting: Internal Medicine

## 2022-05-11 ENCOUNTER — Encounter: Payer: Self-pay | Admitting: Internal Medicine

## 2022-05-11 ENCOUNTER — Ambulatory Visit (INDEPENDENT_AMBULATORY_CARE_PROVIDER_SITE_OTHER): Payer: PPO

## 2022-05-11 ENCOUNTER — Ambulatory Visit (INDEPENDENT_AMBULATORY_CARE_PROVIDER_SITE_OTHER): Payer: PPO | Admitting: Internal Medicine

## 2022-05-11 VITALS — BP 128/84 | HR 103 | Temp 97.1°F | Wt 211.0 lb

## 2022-05-11 VITALS — BP 128/84 | HR 103 | Temp 97.8°F | Resp 17 | Ht 62.0 in | Wt 211.0 lb

## 2022-05-11 DIAGNOSIS — Z0000 Encounter for general adult medical examination without abnormal findings: Secondary | ICD-10-CM | POA: Diagnosis not present

## 2022-05-11 DIAGNOSIS — R7303 Prediabetes: Secondary | ICD-10-CM

## 2022-05-11 DIAGNOSIS — Z0001 Encounter for general adult medical examination with abnormal findings: Secondary | ICD-10-CM | POA: Diagnosis not present

## 2022-05-11 DIAGNOSIS — Z6838 Body mass index (BMI) 38.0-38.9, adult: Secondary | ICD-10-CM | POA: Diagnosis not present

## 2022-05-11 NOTE — Progress Notes (Deleted)
Subjective:   Haley Morris is a 71 y.o. female who presents for Medicare Annual (Subsequent) preventive examination.  Review of Systems    Per HPI unless specifically indicated below. Cardiac Risk Factors include: advanced age (>53mn, >>2women);female gender, hypertension, hyperlipidemia, and prediabetes.           Objective:    Today's Vitals   05/11/22 1545  BP: 128/84  Weight: 211 lb (95.7 kg)  Height: '5\' 2"'$  (1.575 m)  PainSc: 5    Body mass index is 38.59 kg/m.     09/26/2019    9:52 AM 12/05/2018    8:20 AM 11/08/2018    8:35 AM  Advanced Directives  Does Patient Have a Medical Advance Directive? No No Yes  Type of AScientist, physiologicalof ATuronLiving will  Does patient want to make changes to medical advance directive?   No - Guardian declined  Copy of HGravityin Chart?   Yes - validated most recent copy scanned in chart (See row information)  Would patient like information on creating a medical advance directive?  No - Patient declined No - Patient declined    Current Medications (verified) Outpatient Encounter Medications as of 05/11/2022  Medication Sig   acetaminophen (TYLENOL) 500 MG tablet Take 500 mg by mouth every 6 (six) hours as needed.   Ascorbic Acid (VITAMIN C PO) Take by mouth daily.   B Complex Vitamins (VITAMIN B COMPLEX PO) Take by mouth daily.   BOOSTRIX 5-2.5-18.5 LF-MCG/0.5 injection    Calcium Carb-Cholecalciferol (CALCIUM 1000 + D PO) Take by mouth.   CINNAMON PO Take by mouth daily.   ELDERBERRY PO Take by mouth.   Ginger, Zingiber officinalis, (GINGER ROOT) 550 MG CAPS Take by mouth.    ibuprofen (ADVIL) 400 MG tablet Take by mouth.   Multiple Vitamin (MULTIVITAMIN) tablet Take 1 tablet by mouth daily.   TURMERIC PO Take by mouth daily.   oxyCODONE (ROXICODONE) 5 MG immediate release tablet Take 1 tablet (5 mg total) by mouth every 6 (six) hours as needed for severe pain. (Patient not taking:  Reported on 05/11/2022)   No facility-administered encounter medications on file as of 05/11/2022.    Allergies (verified) Cefdinir, Erythromycin, and Hydromet [hydrocodone bit-homatrop mbr]   History: Past Medical History:  Diagnosis Date   Arthritis    knees   Past Surgical History:  Procedure Laterality Date   ABDOMINAL HYSTERECTOMY     BREAST BIOPSY Right 10/05/2019   uKoreabx, path pending, heart marker at 2:30   CATARACT EXTRACTION W/PHACO Left 11/08/2018   Procedure: CATARACT EXTRACTION PHACO AND INTRAOCULAR LENS PLACEMENT ([facility] LEFT;  Surgeon: KEulogio Bear MD;  [facility] [facility]  Service: Ophthalmology;  Laterality: Left;   CATARACT EXTRACTION W/PHACO Right 12/05/2018   Procedure: CATARACT EXTRACTION PHACO AND INTRAOCULAR LENS PLACEMENT (I[facility])  RIGHT;  Surgeon: KEulogio Bear MD;  [place] [facility]  Service: Ophthalmology;  Laterality: Right;   CHOLECYSTECTOMY     COLONOSCOPY     LESION EXCISION     nose   MASS EXCISION Left    thumb   PARTIAL HYSTERECTOMY     Family History  Problem Relation Age of Onset   Heart attack Father    Lung cancer Brother    Social History   Socioeconomic History   Marital status: Widowed    Spouse name: Not on file   Number of children: 1   Years of  education: Not on file   Highest education level: Not on file  Occupational History   Occupation: retired   Tobacco Use   Smoking status: Former   Smokeless tobacco: Never   Tobacco comments:    socially in her 39s  Vaping Use   Vaping Use: Never used  Substance and Sexual Activity   Alcohol use: Not Currently   Drug use: Not Currently   Sexual activity: Not Currently  Other Topics Concern   Not on file  Social History Narrative   Not on file   Social Determinants of Health   Financial Resource Strain: [place]  (05/11/2022)   Overall Financial Resource Strain (CARDIA)    Difficulty of Paying Living Expenses: Not hard at all  Food  Insecurity: No Food Insecurity (05/11/2022)   Hunger Vital Sign    Worried About Running Out of Food in the Last Year: Never true    [place] in the Last Year: Never true  Transportation Needs: No Transportation Needs (05/11/2022)   PRAPARE - Hydrologist (Medical): No    Lack of Transportation (Non-Medical): No  Physical Activity: Insufficiently Active (05/11/2022)   Exercise Vital Sign    Days of Exercise per Week: 2 days    Minutes of Exercise per Session: 10 min  Stress: No Stress Concern Present (05/11/2022)   [place]    Feeling of Stress : Not at all  Social Connections: Moderately Integrated (05/11/2022)   Social Connection and Isolation Panel [NHANES]    Frequency of Communication with Friends and Family: More than three times a week    Frequency of Social Gatherings with Friends and Family: More than three times a week    Attends Religious Services: More than 4 times per year    Active Member of [employer] or Organizations: Yes    Attends Archivist Meetings: More than 4 times per year    Marital Status: Widowed    Tobacco Counseling Counseling given: Not Answered Tobacco comments: socially in her 10s   Clinical Intake:  Pre-visit preparation completed: No  Pain : 0-10 Pain Score: 5  Pain Type: Chronic pain Pain Location: Knee Pain Orientation: Right, Left Pain Descriptors / Indicators: Aching Pain Onset: More than a month ago Pain Frequency: Intermittent     Nutritional Status: BMI > 30  Obese Nutritional Risks: None Diabetes: No  How often do you need to have someone help you when you read instructions, pamphlets, or other written materials from your doctor or pharmacy?: 1 - Never  Diabetic?No  Interpreter Needed?: No  Information entered by :: Donnie Mesa, CMA   Activities of Daily Living    05/11/2022    3:43 PM 05/11/2022    3:28  PM  In your present state of health, do you have any difficulty performing the following activities:  Hearing? 0 0  Vision? 1 0  [facility] Edison Pace   Difficulty concentrating or making decisions? 0 0  Walking or climbing stairs? 1 1  Dressing or bathing? 0 0  Doing errands, shopping? 0 0    Patient Care Team: Jearld Fenton, NP as PCP - General (Internal Medicine)  Indicate any recent Medical Services you may have received from other than [facility] providers in the past year (date may be approximate).     Assessment:   This is a routine wellness examination for Haley Morris.  Hearing/Vision screen Denies  any difficulty with her hearing. Denies any difficulty with vision, since Cataract surgery. Wear readers intermittently.   Dietary issues and exercise activities discussed: Current Exercise Habits: Home exercise routine, Type of exercise: walking, Time (Minutes): 15, Frequency (Times/Week): 2, Weekly Exercise (Minutes/Week): 30, Intensity: Mild   Goals Addressed   None    Depression Screen    05/11/2022    3:27 PM 10/28/2021    8:16 AM 04/28/2021    9:40 AM 10/24/2020    8:13 AM 04/25/2020    3:10 PM 04/24/2020    8:42 AM 09/26/2019    9:49 AM  PHQ 2/9 Scores  PHQ - 2 Score 0 2 0 0 0 0 0  PHQ- 9 Score  2 0 0 0 0     Fall Risk    05/11/2022    3:42 PM 05/11/2022    3:28 PM 10/28/2021    8:16 AM 04/28/2021    9:41 AM 10/24/2020    8:14 AM  Fall Risk   Falls in the past year? 1 1 0 0 0  Number falls in past yr: 1 0 0 0 0  Injury with Fall? 1 1 0 0 0  Risk for fall due to : Impaired balance/gait  No Fall Risks No Fall Risks No Fall Risks  Follow up Falls evaluation completed  Falls evaluation completed Falls evaluation completed Falls evaluation completed    [place]:  Any stairs in or around the home? No  If so, are there any without handrails? Yes  Home free of loose throw rugs in walkways, pet beds, electrical cords,  etc? Yes  Adequate lighting in your home to reduce risk of falls? Yes   ASSISTIVE DEVICES UTILIZED TO PREVENT FALLS:  Life alert? Yes  Use of a cane, walker or w/c? No  Grab bars in the bathroom? Yes  Shower chair or bench in shower? No  Elevated toilet seat or a handicapped toilet? Yes   TIMED UP AND GO:  Was the test performed? Yes .  Length of time to ambulate 10 feet: 10 sec.   Gait slow and steady without use of assistive device  Cognitive Function:        05/11/2022    3:43 PM  6CIT Screen  What Year? 0 points  What month? 0 points  What time? 0 points  Count back from 20 0 points  Months in reverse 0 points  Repeat phrase 0 points  Total Score 0 points    Immunizations Immunization History  Administered Date(s) Administered   Moderna Covid-19 Vaccine Bivalent Booster 57yr & up 01/27/2021   Moderna SARS-COV2 Booster Vaccination 04/02/2021   Moderna Sars-Covid-2 Vaccination 10/31/2019, 11/28/2019    TDAP status: Due, Education has been provided regarding the importance of this vaccine. Advised may receive this vaccine at local pharmacy or Health Dept. Aware to provide a copy of the vaccination record if obtained from local pharmacy or Health Dept. Verbalized acceptance and understanding.  Flu Vaccine status: Declined, Education has been provided regarding the importance of this vaccine but patient still declined. Advised may receive this vaccine at local pharmacy or Health Dept. Aware to provide a copy of the vaccination record if obtained from local pharmacy or Health Dept. Verbalized acceptance and understanding.  Pneumococcal vaccine status: Declined,  Education has been provided regarding the importance of this vaccine but patient still declined. Advised may receive this vaccine at local pharmacy or Health Dept. Aware to provide a copy  of the vaccination record if obtained from local pharmacy or Health Dept. Verbalized acceptance and understanding.   Covid-19  vaccine status: Information provided on how to obtain vaccines.   Qualifies for Shingles Vaccine? Yes   Zostavax completed No   Shingrix Completed?: No.    Education has been provided regarding the importance of this vaccine. Patient has been advised to call insurance company to determine out of pocket expense if they have not yet received this vaccine. Advised may also receive vaccine at local pharmacy or Health Dept. Verbalized acceptance and understanding.  Screening Tests Health Maintenance  Topic Date Due   Zoster Vaccines- Shingrix (1 of 2) Never done   COVID-19 Vaccine (5 - 2023-24 season) 02/13/2022   INFLUENZA VACCINE  09/13/2022 (Originally 01/13/2022)   Pneumonia Vaccine 11+ Years old (1 - PCV) 05/12/2023 (Originally 03/01/2016)   Fecal DNA (Cologuard)  10/09/2022   Medicare Annual Wellness (AWV)  05/12/2023   MAMMOGRAM  09/23/2023   DEXA SCAN  Completed   Hepatitis C Screening  Completed   HPV VACCINES  Aged Out    Health Maintenance  Health Maintenance Due  Topic Date Due   Zoster Vaccines- Shingrix (1 of 2) Never done   COVID-19 Vaccine (5 - 2023-24 season) 02/13/2022    Colorectal cancer screening: Type of screening: Cologuard. Completed 10/09/2019. Repeat every 3 years  Mammogram status: Completed 09/22/2021. Repeat every year  DEXA Scan: 09/13/2019  Lung Cancer Screening: (Low Dose CT Chest recommended if Age 43-80 years, 30 pack-year currently smoking OR have quit w/in 15years.) does qualify.     Additional Screening:  Hepatitis C Screening: does qualify; Completed 08/23/2019  Vision Screening: Recommended annual ophthalmology exams for early detection of glaucoma and other disorders of the eye. Is the patient up to date with their annual eye exam?  Yes  Who is the provider or what is the name of the office in which the patient attends annual eye exams? [facility]  If pt is not established with a provider, would they like to be referred to a  provider to establish care? No .   Dental Screening: Recommended annual dental exams for proper oral hygiene  Community Resource Referral / Chronic Care Management: CRR required this visit?  No   CCM required this visit?  No      Plan:     I have personally reviewed and noted the following in the patient's chart:   Medical and social history Use of alcohol, tobacco or illicit drugs  Current medications and supplements including opioid prescriptions. Patient is not currently taking opioid prescriptions. Functional ability and status Nutritional status Physical activity Advanced directives List of other physicians Hospitalizations, surgeries, and ER visits in previous 12 months Vitals Screenings to include cognitive, depression, and falls Referrals and appointments  In addition, I have reviewed and discussed with patient certain preventive protocols, quality metrics, and best practice recommendations. A written personalized care plan for preventive services as well as general preventive health recommendations were provided to patient.    Haley Morris , Thank you for taking time to come for your Medicare Wellness Visit. I appreciate your ongoing commitment to your health goals. Please review the following plan we discussed and let me know if I can assist you in the future.   These are the goals we discussed:  Goals   None     This is a list of the screening recommended for you and due dates:  Health Maintenance  Topic Date  Due   Zoster (Shingles) Vaccine (1 of 2) Never done   COVID-19 Vaccine (5 - 2023-24 season) 02/13/2022   Flu Shot  09/13/2022*   Pneumonia Vaccine (1 - PCV) 05/12/2023*   Cologuard (Stool DNA test)  10/09/2022   Medicare Annual Wellness Visit  05/12/2023   Mammogram  09/23/2023   DEXA scan (bone density measurement)  Completed   Hepatitis C Screening: USPSTF Recommendation to screen - Ages 54-79 yo.  Completed   HPV Vaccine  Aged Out  *Topic was  postponed. The date shown is not the original due date.     Wilson Singer, [place]   05/11/2022   Nurse Notes: Approximately 30 minute Non-Face -To-Face Medicare Wellness Visit

## 2022-05-11 NOTE — Patient Instructions (Signed)
Health Maintenance for Postmenopausal Women Menopause is a normal process in which your ability to get pregnant comes to an end. This process happens slowly over many months or years, usually between the ages of 48 and 55. Menopause is complete when you have missed your menstrual period for 12 months. It is important to talk with your health care provider about some of the most common conditions that affect women after menopause (postmenopausal women). These include heart disease, cancer, and bone loss (osteoporosis). Adopting a healthy lifestyle and getting preventive care can help to promote your health and wellness. The actions you take can also lower your chances of developing some of these common conditions. What are the signs and symptoms of menopause? During menopause, you may have the following symptoms: Hot flashes. These can be moderate or severe. Night sweats. Decrease in sex drive. Mood swings. Headaches. Tiredness (fatigue). Irritability. Memory problems. Problems falling asleep or staying asleep. Talk with your health care provider about treatment options for your symptoms. Do I need hormone replacement therapy? Hormone replacement therapy is effective in treating symptoms that are caused by menopause, such as hot flashes and night sweats. Hormone replacement carries certain risks, especially as you become older. If you are thinking about using estrogen or estrogen with progestin, discuss the benefits and risks with your health care provider. How can I reduce my risk for heart disease and stroke? The risk of heart disease, heart attack, and stroke increases as you age. One of the causes may be a change in the body's hormones during menopause. This can affect how your body uses dietary fats, triglycerides, and cholesterol. Heart attack and stroke are medical emergencies. There are many things that you can do to help prevent heart disease and stroke. Watch your blood pressure High  blood pressure causes heart disease and increases the risk of stroke. This is more likely to develop in people who have high blood pressure readings or are overweight. Have your blood pressure checked: Every 3-5 years if you are 18-39 years of age. Every year if you are 40 years old or older. Eat a healthy diet  Eat a diet that includes plenty of vegetables, fruits, low-fat dairy products, and lean protein. Do not eat a lot of foods that are high in solid fats, added sugars, or sodium. Get regular exercise Get regular exercise. This is one of the most important things you can do for your health. Most adults should: Try to exercise for at least 150 minutes each week. The exercise should increase your heart rate and make you sweat (moderate-intensity exercise). Try to do strengthening exercises at least twice each week. Do these in addition to the moderate-intensity exercise. Spend less time sitting. Even light physical activity can be beneficial. Other tips Work with your health care provider to achieve or maintain a healthy weight. Do not use any products that contain nicotine or tobacco. These products include cigarettes, chewing tobacco, and vaping devices, such as e-cigarettes. If you need help quitting, ask your health care provider. Know your numbers. Ask your health care provider to check your cholesterol and your blood sugar (glucose). Continue to have your blood tested as directed by your health care provider. Do I need screening for cancer? Depending on your health history and family history, you may need to have cancer screenings at different stages of your life. This may include screening for: Breast cancer. Cervical cancer. Lung cancer. Colorectal cancer. What is my risk for osteoporosis? After menopause, you may be   at increased risk for osteoporosis. Osteoporosis is a condition in which bone destruction happens more quickly than new bone creation. To help prevent osteoporosis or  the bone fractures that can happen because of osteoporosis, you may take the following actions: If you are 19-50 years old, get at least 1,000 mg of calcium and at least 600 international units (IU) of vitamin D per day. If you are older than age 50 but younger than age 70, get at least 1,200 mg of calcium and at least 600 international units (IU) of vitamin D per day. If you are older than age 70, get at least 1,200 mg of calcium and at least 800 international units (IU) of vitamin D per day. Smoking and drinking excessive alcohol increase the risk of osteoporosis. Eat foods that are rich in calcium and vitamin D, and do weight-bearing exercises several times each week as directed by your health care provider. How does menopause affect my mental health? Depression may occur at any age, but it is more common as you become older. Common symptoms of depression include: Feeling depressed. Changes in sleep patterns. Changes in appetite or eating patterns. Feeling an overall lack of motivation or enjoyment of activities that you previously enjoyed. Frequent crying spells. Talk with your health care provider if you think that you are experiencing any of these symptoms. General instructions See your health care provider for regular wellness exams and vaccines. This may include: Scheduling regular health, dental, and eye exams. Getting and maintaining your vaccines. These include: Influenza vaccine. Get this vaccine each year before the flu season begins. Pneumonia vaccine. Shingles vaccine. Tetanus, diphtheria, and pertussis (Tdap) booster vaccine. Your health care provider may also recommend other immunizations. Tell your health care provider if you have ever been abused or do not feel safe at home. Summary Menopause is a normal process in which your ability to get pregnant comes to an end. This condition causes hot flashes, night sweats, decreased interest in sex, mood swings, headaches, or lack  of sleep. Treatment for this condition may include hormone replacement therapy. Take actions to keep yourself healthy, including exercising regularly, eating a healthy diet, watching your weight, and checking your blood pressure and blood sugar levels. Get screened for cancer and depression. Make sure that you are up to date with all your vaccines. This information is not intended to replace advice given to you by your health care provider. Make sure you discuss any questions you have with your health care provider. Document Revised: 10/21/2020 Document Reviewed: 10/21/2020 Elsevier Patient Education  2023 [employer]  

## 2022-05-11 NOTE — Progress Notes (Signed)
Subjective:    Patient ID: Haley Morris, female    DOB: 11/16/1950, 71 y.o.   MRN: [id number]  HPI  Pt presents to the clinic today for her annual exam.  Flu: Never Tetanus: >10 years ago Covid: Moderna x4 Pneumovax: Never Prevnar: Never Shingrix: Never Pap smear: Hysterectomy Mammogram: 09/2021 Bone density: 08/2019 Colon screening: 09/2019 Vision screening: annually Dentist: biannually  Diet: She does eat meat. She consumes fruits and veggies. She does eat fried foods. She drinks mostly water. Exercise: None  Review of Systems     Past Medical History:  Diagnosis Date   Arthritis    knees    Current Outpatient Medications  Medication Sig Dispense Refill   acetaminophen (TYLENOL) 500 MG tablet Take 500 mg by mouth every 6 (six) hours as needed.     Ascorbic Acid (VITAMIN C PO) Take by mouth daily.     B Complex Vitamins (VITAMIN B COMPLEX PO) Take by mouth daily.     BOOSTRIX 5-2.5-18.5 LF-MCG/0.5 injection      Calcium Carb-Cholecalciferol (CALCIUM 1000 + D PO) Take by mouth.     CINNAMON PO Take by mouth daily.     ELDERBERRY PO Take by mouth.     Ginger, Zingiber officinalis, (GINGER ROOT) 550 MG CAPS Take by mouth.      ibuprofen (ADVIL) 400 MG tablet Take by mouth.     MODERNA COVID-19 VACCINE 100 MCG/0.5ML injection      Multiple Vitamin (MULTIVITAMIN) tablet Take 1 tablet by mouth daily.     oxyCODONE (ROXICODONE) 5 MG immediate release tablet Take 1 tablet (5 mg total) by mouth every 6 (six) hours as needed for severe pain. 10 tablet 0   TURMERIC PO Take by mouth daily.     No current facility-administered medications for this visit.    Allergies  Allergen Reactions   Cefdinir Swelling    "throat closes"   Erythromycin     Cousin is allergic   Hydromet [Hydrocodone Bit-Homatrop Mbr] Nausea And Vomiting    Family History  Problem Relation Age of Onset   Heart attack Father    Lung cancer Brother     Social History   Socioeconomic History    Marital status: Widowed    Spouse name: Not on file   Number of children: Not on file   Years of education: Not on file   Highest education level: Not on file  Occupational History   Occupation: retired   Tobacco Use   Smoking status: Former   Smokeless tobacco: Never   Tobacco comments:    socially in her 56s  Vaping Use   Vaping Use: Never used  Substance and Sexual Activity   Alcohol use: Not Currently   Drug use: Not Currently   Sexual activity: Not Currently  Other Topics Concern   Not on file  Social History Narrative   Not on file   Social Determinants of Health   Financial Resource Strain: [place]  (09/26/2019)   Overall Financial Resource Strain (CARDIA)    Difficulty of Paying Living Expenses: Not hard at all  Food Insecurity: No Food Insecurity (09/26/2019)   Hunger Vital Sign    Worried About Running Out of Food in the Last Year: Never true    [place] in the Last Year: Never true  Transportation Needs: No Transportation Needs (09/26/2019)   PRAPARE - Hydrologist (Medical): No    Lack of Transportation (Non-Medical):  No  Physical Activity: Not on file  Stress: Not on file  Social Connections: Moderately Isolated (09/26/2019)   Social Connection and Isolation Panel [NHANES]    Frequency of Communication with Friends and Family: More than three times a week    Frequency of Social Gatherings with Friends and Family: More than three times a week    Attends Religious Services: More than 4 times per year    Active Member of [employer] or Organizations: No    Attends Archivist Meetings: Never    Marital Status: Widowed  Intimate Partner Violence: Not on file     Constitutional: Denies fever, malaise, fatigue, headache or abrupt weight changes.  HEENT: Denies eye pain, eye redness, ear pain, ringing in the ears, wax buildup, runny nose, nasal congestion, bloody nose, or sore throat. Respiratory: Denies difficulty  breathing, shortness of breath, cough or sputum production.   Cardiovascular: Denies chest pain, chest tightness, palpitations or swelling in the hands or feet.  Gastrointestinal: Denies abdominal pain, bloating, constipation, diarrhea or blood in the stool.  GU: Denies urgency, frequency, pain with urination, burning sensation, blood in urine, odor or discharge. Musculoskeletal: Patient reports joint pain in back and knees.  Denies decrease in range of motion, difficulty with gait, muscle pain or joint swelling.  Skin: Denies redness, rashes, lesions or ulcercations.  Neurological: Denies dizziness, difficulty with memory, difficulty with speech or problems with balance and coordination.  Psych: Denies anxiety, depression, SI/HI.  No other specific complaints in a complete review of systems (except as listed in HPI above).  Objective:   Physical Exam  BP 128/84 (BP Location: Right Arm, Patient Position: Sitting, Cuff Size: Normal)   Pulse (!) 103   Temp (!) 97.1 F (36.2 C) (Temporal)   Wt 211 lb (95.7 kg)   SpO2 99%   BMI 38.59 kg/m   Wt Readings from Last 3 Encounters:  12/12/21 227 lb (103 kg)  10/28/21 232 lb (105.2 kg)  04/28/21 227 lb (103 kg)    General: Appears her stated age, obese, in NAD. Skin: Warm, dry and intact.  HEENT: Head: normal shape and size; Eyes: sclera white, no icterus, conjunctiva pink, PERRLA, strabismus noted;  Neck:  Neck supple, trachea midline. No masses, lumps or thyromegaly present.  Cardiovascular: Tachycardic with normal rhythm. S1,S2 noted.  No murmur, rubs or gallops noted. No JVD or BLE edema. No carotid bruits noted. Pulmonary/Chest: Normal effort and positive vesicular breath sounds. No respiratory distress. No wheezes, rales or ronchi noted.  Abdomen: Soft and nontender. Normal bowel sounds.  Musculoskeletal: Strength 5/5 BUE. Limping gait.  Neurological: Alert and oriented. Cranial nerves II-XII grossly intact. Coordination normal.   Psychiatric: Mood and affect normal. Behavior is normal. Judgment and thought content normal.     BMET    Component Value Date/Time   NA 139 10/28/2021 0812   K 4.3 10/28/2021 0812   CL 105 10/28/2021 0812   CO2 23 10/28/2021 0812   GLUCOSE 136 (H) 10/28/2021 0812   BUN 19 10/28/2021 0812   CREATININE 0.82 10/28/2021 0812   CALCIUM 9.8 10/28/2021 0812   GFRNONAA 82 01/18/2020 1033   GFRAA 95 01/18/2020 1033    Lipid Panel     Component Value Date/Time   CHOL 269 (H) 04/28/2021 1002   TRIG 95 04/28/2021 1002   HDL 56 04/28/2021 1002   CHOLHDL 4.8 04/28/2021 1002   LDLCALC 192 (H) 04/28/2021 1002    CBC    Component Value Date/Time  WBC 7.4 04/28/2021 1002   RBC 4.47 04/28/2021 1002   HGB 13.4 04/28/2021 1002   HCT 39.9 04/28/2021 1002   PLT 288 04/28/2021 1002   MCV 89.3 04/28/2021 1002   MCH 30.0 04/28/2021 1002   MCHC 33.6 04/28/2021 1002   RDW 12.4 04/28/2021 1002   LYMPHSABS 2,240 01/18/2020 1033   EOSABS 152 01/18/2020 1033   BASOSABS 32 01/18/2020 1033    Hgb A1C Lab Results  Component Value Date   HGBA1C 6.4 (H) 10/28/2021           Assessment & Plan:   Preventative Health Maintenance:  She declines flu shot She declines tetanus shot Encouraged her to get her COVID booster She declines Pneumovax or Prevnar She declines Shingrix She no longer needs to screen for cervical cancer Mammogram UTD Bone density UTD Colon screening due 09/2022 Encouraged her to consume a balanced diet and exercise regimen Advised her to see an eye doctor and dentist annually We will check CBC, c-Met, lipid and A1c today  RTC in 6 months, follow-up chronic conditions Webb Silversmith, NP

## 2022-05-11 NOTE — Assessment & Plan Note (Signed)
Encouraged diet and exercise for weight loss ?

## 2022-05-11 NOTE — Patient Instructions (Signed)

## 2022-05-12 LAB — COMPLETE METABOLIC PANEL WITH GFR
AG Ratio: 1.3 (calc) (ref 1.0–2.5)
ALT: 19 U/L (ref 6–29)
AST: 21 U/L (ref 10–35)
Albumin: 4.4 g/dL (ref 3.6–5.1)
Alkaline phosphatase (APISO): 85 U/L (ref 37–153)
BUN: 21 mg/dL (ref 7–25)
CO2: 24 mmol/L (ref 20–32)
Calcium: 10.2 mg/dL (ref 8.6–10.4)
Chloride: 104 mmol/L (ref 98–110)
Creat: 0.81 mg/dL (ref 0.60–1.00)
Globulin: 3.5 g/dL (calc) (ref 1.9–3.7)
Glucose, Bld: 132 mg/dL — ABNORMAL HIGH (ref 65–99)
Potassium: 4.2 mmol/L (ref 3.5–5.3)
Sodium: 139 mmol/L (ref 135–146)
Total Bilirubin: 1 mg/dL (ref 0.2–1.2)
Total Protein: 7.9 g/dL (ref 6.1–8.1)
eGFR: 78 mL/min/{1.73_m2} (ref >=60)

## 2022-05-12 LAB — CBC
HCT: 40.7 % (ref 35.0–45.0)
Hemoglobin: 13.9 g/dL (ref 11.7–15.5)
MCH: 30.6 pg (ref 27.0–33.0)
MCHC: 34.2 g/dL (ref 32.0–36.0)
MCV: 89.6 fL (ref 80.0–100.0)
MPV: 10.9 fL (ref 7.5–12.5)
Platelets: 309 10*3/uL (ref 140–400)
RBC: 4.54 10*6/uL (ref 3.80–5.10)
RDW: 12.2 % (ref 11.0–15.0)
WBC: 7.5 10*3/uL (ref 3.8–10.8)

## 2022-05-12 LAB — HEMOGLOBIN A1C
Hgb A1c MFr Bld: 6.3 % of total Hgb — ABNORMAL HIGH (ref <5.7)
Mean Plasma Glucose: 134 mg/dL
eAG (mmol/L): 7.4 mmol/L

## 2022-05-12 LAB — LIPID PANEL
Cholesterol: 261 mg/dL — ABNORMAL HIGH (ref <200)
HDL: 56 mg/dL (ref >=50)
LDL Cholesterol (Calc): 175 mg/dL (calc) — ABNORMAL HIGH
Non-HDL Cholesterol (Calc): 205 mg/dL (calc) — ABNORMAL HIGH (ref <130)
Total CHOL/HDL Ratio: 4.7 (calc) (ref <5.0)
Triglycerides: 156 mg/dL — ABNORMAL HIGH (ref <150)

## 2022-05-13 ENCOUNTER — Telehealth: Payer: Self-pay

## 2022-05-13 NOTE — Telephone Encounter (Signed)
-----   Message from Jearld Fenton, NP sent at 05/12/2022 11:04 AM EST ----- Liver and kidney function is normal.  Her cholesterol and triglycerides are elevated.  I strongly recommend cholesterol-lowering medication to reduce her risk of heart attack and stroke.  Please let me know if she is agreeable with this and I will send this in.  A1c is stable at 6.3%.  She should consume a low-carb, low saturated fat diet and try to exercise for weight loss.  Her blood counts are normal.

## 2022-05-13 NOTE — Telephone Encounter (Signed)
LMTCB 05/13/2022.  PEC please advise pt when she calls back.   Thanks,   -Mickel Baas

## 2022-05-18 NOTE — Progress Notes (Signed)
Subjective:   Haley Morris is a 71 y.o. female who presents for Medicare Annual (Subsequent) preventive examination.  Review of Systems    Per HPI unless specifically indicated below. Cardiac Risk Factors include: advanced age (>93mn, >>94women);female gender, hypertension, hyperlipidemia, and prediabetes.           Objective:    Today's Vitals   05/11/22 1545  BP: 128/84  Pulse: (!) 103  Resp: 17  Temp: 97.8 F (36.6 C)  TempSrc: Oral  Weight: 211 lb (95.7 kg)  Height: '5\' 2"'$  (1.575 m)  PainSc: 5   PainLoc: Knee   Body mass index is 38.59 kg/m.     05/11/2022    4:01 PM 09/26/2019    9:52 AM 12/05/2018    8:20 AM 11/08/2018    8:35 AM  Advanced Directives  Does Patient Have a Medical Advance Directive? No No No Yes  Type of AScientist, research (medical)Living will  Does patient want to make changes to medical advance directive?    No - Guardian declined  Copy of [place] Chart?    Yes - validated most recent copy scanned in chart (See row information)  Would patient like information on creating a medical advance directive? No - Patient declined  No - Patient declined No - Patient declined    Current Medications (verified) Outpatient Encounter Medications as of 05/11/2022  Medication Sig   acetaminophen (TYLENOL) 500 MG tablet Take 500 mg by mouth every 6 (six) hours as needed.   Ascorbic Acid (VITAMIN C PO) Take by mouth daily.   B Complex Vitamins (VITAMIN B COMPLEX PO) Take by mouth daily.   BOOSTRIX 5-2.5-18.5 LF-MCG/0.5 injection    Calcium Carb-Cholecalciferol (CALCIUM 1000 + D PO) Take by mouth.   CINNAMON PO Take by mouth daily.   ELDERBERRY PO Take by mouth.   Ginger, Zingiber officinalis, (GINGER ROOT) 550 MG CAPS Take by mouth.    ibuprofen (ADVIL) 400 MG tablet Take by mouth.   Multiple Vitamin (MULTIVITAMIN) tablet Take 1 tablet by mouth daily.   TURMERIC PO Take by mouth daily.   oxyCODONE  (ROXICODONE) 5 MG immediate release tablet Take 1 tablet (5 mg total) by mouth every 6 (six) hours as needed for severe pain. (Patient not taking: Reported on 05/11/2022)   No facility-administered encounter medications on file as of 05/11/2022.    Allergies (verified) Cefdinir, Erythromycin, and Hydromet [hydrocodone bit-homatrop mbr]   History: Past Medical History:  Diagnosis Date   Arthritis    knees   Past Surgical History:  Procedure Laterality Date   ABDOMINAL HYSTERECTOMY     BREAST BIOPSY Right 10/05/2019   uKoreabx, path pending, heart marker at 2:30   CATARACT EXTRACTION W/PHACO Left 11/08/2018   Procedure: CATARACT EXTRACTION PHACO AND INTRAOCULAR LENS PLACEMENT ([place] LEFT;  Surgeon: KEulogio Bear MD;  [place] [facility]  Service: Ophthalmology;  Laterality: Left;   CATARACT EXTRACTION W/PHACO Right 12/05/2018   Procedure: CATARACT EXTRACTION PHACO AND INTRAOCULAR LENS PLACEMENT (I[facility])  RIGHT;  Surgeon: KEulogio Bear MD;  [place] [facility]  Service: Ophthalmology;  Laterality: Right;   CHOLECYSTECTOMY     COLONOSCOPY     LESION EXCISION     nose   MASS EXCISION Left    thumb   PARTIAL HYSTERECTOMY     Family History  Problem Relation Age of Onset   Heart attack Father    Lung cancer  Brother    Social History   Socioeconomic History   Marital status: Widowed    Spouse name: Not on file   Number of children: 1   Years of education: Not on file   Highest education level: Not on file  Occupational History   Occupation: retired   Tobacco Use   Smoking status: Former   Smokeless tobacco: Never   Tobacco comments:    socially in her 75s  Vaping Use   Vaping Use: Never used  Substance and Sexual Activity   Alcohol use: Not Currently   Drug use: Not Currently   Sexual activity: Not Currently  Other Topics Concern   Not on file  Social History Narrative   Not on file   Social Determinants of Health   Financial Resource  Strain: [place]  (05/11/2022)   Overall Financial Resource Strain (CARDIA)    Difficulty of Paying Living Expenses: Not hard at all  Food Insecurity: No Food Insecurity (05/11/2022)   Hunger Vital Sign    Worried About Running Out of Food in the Last Year: Never true    [place] in the Last Year: Never true  Transportation Needs: No Transportation Needs (05/11/2022)   PRAPARE - Hydrologist (Medical): No    Lack of Transportation (Non-Medical): No  Physical Activity: Insufficiently Active (05/11/2022)   Exercise Vital Sign    Days of Exercise per Week: 2 days    Minutes of Exercise per Session: 10 min  Stress: No Stress Concern Present (05/11/2022)   [place]    Feeling of Stress : Not at all  Social Connections: Moderately Integrated (05/11/2022)   Social Connection and Isolation Panel [NHANES]    Frequency of Communication with Friends and Family: More than three times a week    Frequency of Social Gatherings with Friends and Family: More than three times a week    Attends Religious Services: More than 4 times per year    Active Member of [employer] or Organizations: Yes    Attends Archivist Meetings: More than 4 times per year    Marital Status: Widowed    Tobacco Counseling Counseling given: Not Answered Tobacco comments: socially in her 69s   Clinical Intake:  Pre-visit preparation completed: No  Pain : 0-10 Pain Score: 5  Pain Type: Chronic pain Pain Location: Knee Pain Orientation: Right, Left Pain Descriptors / Indicators: Aching Pain Onset: More than a month ago Pain Frequency: Intermittent     Nutritional Status: BMI > 30  Obese Nutritional Risks: None Diabetes: No  How often do you need to have someone help you when you read instructions, pamphlets, or other written materials from your doctor or pharmacy?: 1 - Never  Diabetic?No  Interpreter  Needed?: No  Information entered by :: Donnie Mesa, CMA   Activities of Daily Living    05/11/2022    3:43 PM 05/11/2022    3:28 PM  In your present state of health, do you have any difficulty performing the following activities:  Hearing? 0 0  Vision? 1 0  Comment [facility]ace   Difficulty concentrating or making decisions? 0 0  Walking or climbing stairs? 1 1  Dressing or bathing? 0 0  Doing errands, shopping? 0 0    Patient Care Team: Jearld Fenton, NP as PCP - General (Internal Medicine)  Indicate any recent Medical Services you may have  received from other than [facility] providers in the past year (date may be approximate).     Assessment:   This is a routine wellness examination for Haley Morris.  Hearing/Vision screen Denies any difficulty with her hearing. Denies any difficulty with vision, since Cataract surgery. Wear readers intermittently.   Dietary issues and exercise activities discussed: Current Exercise Habits: Home exercise routine, Type of exercise: walking, Time (Minutes): 15, Frequency (Times/Week): 2, Weekly Exercise (Minutes/Week): 30, Intensity: Mild   Goals Addressed   None   Depression Screen    05/11/2022    3:27 PM 10/28/2021    8:16 AM 04/28/2021    9:40 AM 10/24/2020    8:13 AM 04/25/2020    3:10 PM 04/24/2020    8:42 AM 09/26/2019    9:49 AM  PHQ 2/9 Scores  PHQ - 2 Score 0 2 0 0 0 0 0  PHQ- 9 Score  2 0 0 0 0     Fall Risk    05/11/2022    3:42 PM 05/11/2022    3:28 PM 10/28/2021    8:16 AM 04/28/2021    9:41 AM 10/24/2020    8:14 AM  Fall Risk   Falls in the past year? 1 1 0 0 0  Number falls in past yr: 1 0 0 0 0  Injury with Fall? 1 1 0 0 0  Risk for fall due to : Impaired balance/gait  No Fall Risks No Fall Risks No Fall Risks  Follow up Falls evaluation completed  Falls evaluation completed Falls evaluation completed Falls evaluation completed    [place]:  Any stairs in or  around the home? No  If so, are there any without handrails? Yes  Home free of loose throw rugs in walkways, pet beds, electrical cords, etc? Yes  Adequate lighting in your home to reduce risk of falls? Yes   ASSISTIVE DEVICES UTILIZED TO PREVENT FALLS:  Life alert? Yes  Use of a cane, walker or w/c? No  Grab bars in the bathroom? Yes  Shower chair or bench in shower? No  Elevated toilet seat or a handicapped toilet? Yes   TIMED UP AND GO:  Was the test performed? Yes .  Length of time to ambulate 10 feet: 10 sec.   Gait slow and steady without use of assistive device  Cognitive Function:        05/11/2022    3:43 PM  6CIT Screen  What Year? 0 points  What month? 0 points  What time? 0 points  Count back from 20 0 points  Months in reverse 0 points  Repeat phrase 0 points  Total Score 0 points    Immunizations Immunization History  Administered Date(s) Administered   Moderna Covid-19 Vaccine Bivalent Booster 74yr & up 01/27/2021   Moderna SARS-COV2 Booster Vaccination 04/02/2021   Moderna Sars-Covid-2 Vaccination 10/31/2019, 11/28/2019    TDAP status: Due, Education has been provided regarding the importance of this vaccine. Advised may receive this vaccine at local pharmacy or Health Dept. Aware to provide a copy of the vaccination record if obtained from local pharmacy or Health Dept. Verbalized acceptance and understanding.  Flu Vaccine status: Declined, Education has been provided regarding the importance of this vaccine but patient still declined. Advised may receive this vaccine at local pharmacy or Health Dept. Aware to provide a copy of the vaccination record if obtained from local pharmacy or Health Dept. Verbalized acceptance and understanding.  Pneumococcal vaccine status:  Declined,  Education has been provided regarding the importance of this vaccine but patient still declined. Advised may receive this vaccine at local pharmacy or Health Dept. Aware to  provide a copy of the vaccination record if obtained from local pharmacy or Health Dept. Verbalized acceptance and understanding.   Covid-19 vaccine status: Information provided on how to obtain vaccines.   Qualifies for Shingles Vaccine? Yes   Zostavax completed No   Shingrix Completed?: No.    Education has been provided regarding the importance of this vaccine. Patient has been advised to call insurance company to determine out of pocket expense if they have not yet received this vaccine. Advised may also receive vaccine at local pharmacy or Health Dept. Verbalized acceptance and understanding.  Screening Tests Health Maintenance  Topic Date Due   DTaP/Tdap/Td (1 - Tdap) Never done   Zoster Vaccines- Shingrix (1 of 2) Never done   COVID-19 Vaccine (5 - 2023-24 season) 02/13/2022   INFLUENZA VACCINE  09/13/2022 (Originally 01/13/2022)   Pneumonia Vaccine 77+ Years old (1 - PCV) 05/12/2023 (Originally 03/01/2016)   Fecal DNA (Cologuard)  10/09/2022   Medicare Annual Wellness (AWV)  05/12/2023   MAMMOGRAM  09/23/2023   DEXA SCAN  Completed   Hepatitis C Screening  Completed   HPV VACCINES  Aged Out    Health Maintenance  Health Maintenance Due  Topic Date Due   DTaP/Tdap/Td (1 - Tdap) Never done   Zoster Vaccines- Shingrix (1 of 2) Never done   COVID-19 Vaccine (5 - 2023-24 season) 02/13/2022    Colorectal cancer screening: Type of screening: Cologuard. Completed 10/09/2019. Repeat every 3 years  Mammogram status: Completed 09/22/2021. Repeat every year  DEXA Scan: 09/13/2019  Lung Cancer Screening: (Low Dose CT Chest recommended if Age 30-80 years, 30 pack-year currently smoking OR have quit w/in 15years.) does qualify.     Additional Screening:  Hepatitis C Screening: does qualify; Completed 08/23/2019  Vision Screening: Recommended annual ophthalmology exams for early detection of glaucoma and other disorders of the eye. Is the patient up to date with their annual eye  exam?  Yes  Who is the provider or what is the name of the office in which the patient attends annual eye exams? [facility]  If pt is not established with a provider, would they like to be referred to a provider to establish care? No .   Dental Screening: Recommended annual dental exams for proper oral hygiene  Community Resource Referral / Chronic Care Management: CRR required this visit?  No   CCM required this visit?  No      Plan:     I have personally reviewed and noted the following in the patient's chart:   Medical and social history Use of alcohol, tobacco or illicit drugs  Current medications and supplements including opioid prescriptions. Patient is not currently taking opioid prescriptions. Functional ability and status Nutritional status Physical activity Advanced directives List of other physicians Hospitalizations, surgeries, and ER visits in previous 12 months Vitals Screenings to include cognitive, depression, and falls Referrals and appointments  In addition, I have reviewed and discussed with patient certain preventive protocols, quality metrics, and best practice recommendations. A written personalized care plan for preventive services as well as general preventive health recommendations were provided to patient.    Haley Morris , Thank you for taking time to come for your Medicare Wellness Visit. I appreciate your ongoing commitment to your health goals. Please review the following plan we discussed  and let me know if I can assist you in the future.   These are the goals we discussed:  Goals   None     This is a list of the screening recommended for you and due dates:  Health Maintenance  Topic Date Due   DTaP/Tdap/Td vaccine (1 - Tdap) Never done   Zoster (Shingles) Vaccine (1 of 2) Never done   COVID-19 Vaccine (5 - 2023-24 season) 02/13/2022   Flu Shot  09/13/2022*   Pneumonia Vaccine (1 - PCV) 05/12/2023*   Cologuard (Stool DNA test)   10/09/2022   Medicare Annual Wellness Visit  05/12/2023   Mammogram  09/23/2023   DEXA scan (bone density measurement)  Completed   Hepatitis C Screening: USPSTF Recommendation to screen - Ages 25-79 yo.  Completed   HPV Vaccine  Aged Out  *Topic was postponed. The date shown is not the original due date.     Wilson Singer, [place]   05/18/2022   Nurse Notes: Approximately 30 minute Face -To-Face Medicare Wellness Visit

## 2022-07-17 DIAGNOSIS — S32011D Stable burst fracture of first lumbar vertebra, subsequent encounter for fracture with routine healing: Secondary | ICD-10-CM | POA: Diagnosis not present

## 2022-07-20 DIAGNOSIS — S32011D Stable burst fracture of first lumbar vertebra, subsequent encounter for fracture with routine healing: Secondary | ICD-10-CM | POA: Diagnosis not present

## 2022-07-23 DIAGNOSIS — S32011D Stable burst fracture of first lumbar vertebra, subsequent encounter for fracture with routine healing: Secondary | ICD-10-CM | POA: Diagnosis not present

## 2022-07-28 DIAGNOSIS — S32011D Stable burst fracture of first lumbar vertebra, subsequent encounter for fracture with routine healing: Secondary | ICD-10-CM | POA: Diagnosis not present

## 2022-08-03 DIAGNOSIS — S32011D Stable burst fracture of first lumbar vertebra, subsequent encounter for fracture with routine healing: Secondary | ICD-10-CM | POA: Diagnosis not present

## 2022-08-10 DIAGNOSIS — S32011D Stable burst fracture of first lumbar vertebra, subsequent encounter for fracture with routine healing: Secondary | ICD-10-CM | POA: Diagnosis not present

## 2022-08-27 DIAGNOSIS — S32011D Stable burst fracture of first lumbar vertebra, subsequent encounter for fracture with routine healing: Secondary | ICD-10-CM | POA: Diagnosis not present

## 2022-09-03 DIAGNOSIS — S32011D Stable burst fracture of first lumbar vertebra, subsequent encounter for fracture with routine healing: Secondary | ICD-10-CM | POA: Diagnosis not present

## 2022-09-10 ENCOUNTER — Ambulatory Visit
Admission: RE | Admit: 2022-09-10 | Discharge: 2022-09-10 | Disposition: A | Discharge status: Home / Self Care | Payer: PPO | Source: Ambulatory Visit | Attending: Internal Medicine | Admitting: Internal Medicine

## 2022-09-10 ENCOUNTER — Ambulatory Visit (INDEPENDENT_AMBULATORY_CARE_PROVIDER_SITE_OTHER): Payer: PPO | Admitting: Internal Medicine

## 2022-09-10 ENCOUNTER — Encounter: Payer: Self-pay | Admitting: Internal Medicine

## 2022-09-10 ENCOUNTER — Ambulatory Visit
Admission: RE | Admit: 2022-09-10 | Discharge: 2022-09-10 | Disposition: A | Discharge status: Home / Self Care | Payer: PPO | Attending: Internal Medicine | Admitting: Internal Medicine

## 2022-09-10 VITALS — BP 122/80 | HR 81 | Ht 62.0 in | Wt 203.2 lb

## 2022-09-10 DIAGNOSIS — M1712 Unilateral primary osteoarthritis, left knee: Secondary | ICD-10-CM | POA: Diagnosis not present

## 2022-09-10 DIAGNOSIS — Z6837 Body mass index (BMI) 37.0-37.9, adult: Secondary | ICD-10-CM | POA: Diagnosis not present

## 2022-09-10 DIAGNOSIS — G8929 Other chronic pain: Secondary | ICD-10-CM | POA: Diagnosis not present

## 2022-09-10 DIAGNOSIS — M25562 Pain in left knee: Secondary | ICD-10-CM | POA: Diagnosis not present

## 2022-09-10 DIAGNOSIS — M25561 Pain in right knee: Secondary | ICD-10-CM | POA: Diagnosis not present

## 2022-09-10 DIAGNOSIS — M1711 Unilateral primary osteoarthritis, right knee: Secondary | ICD-10-CM | POA: Diagnosis not present

## 2022-09-10 NOTE — Assessment & Plan Note (Signed)
Encourage diet and exercise for weight loss as this can help reduce knee pain

## 2022-09-10 NOTE — Patient Instructions (Signed)
Knee Exercises Ask your health care provider which exercises are safe for you. Do exercises exactly as told by your health care provider and adjust them as directed. It is normal to feel mild stretching, pulling, tightness, or discomfort as you do these exercises. Stop right away if you feel sudden pain or your pain gets worse. Do not begin these exercises until told by your health care provider. Stretching and range-of-motion exercises These exercises warm up your muscles and joints and improve the movement and flexibility of your knee. These exercises also help to relieve pain and swelling. Knee extension, prone  Lie on your abdomen (prone position) on a bed. Place your left / right knee just beyond the edge of the surface so your knee is not on the bed. You can put a towel under your left / right thigh just above your kneecap for comfort. Relax your leg muscles and allow gravity to straighten your knee (extension). You should feel a stretch behind your left / right knee. Hold this position for __________ seconds. Scoot up so your knee is supported between repetitions. Repeat __________ times. Complete this exercise __________ times a day. Knee flexion, active  Lie on your back with both legs straight. If this causes back discomfort, bend your left / right knee so your foot is flat on the floor. Slowly slide your left / right heel back toward your buttocks. Stop when you feel a gentle stretch in the front of your knee or thigh (flexion). Hold this position for __________ seconds. Slowly slide your left / right heel back to the starting position. Repeat __________ times. Complete this exercise __________ times a day. Quadriceps stretch, prone  Lie on your abdomen on a firm surface, such as a bed or padded floor. Bend your left / right knee and hold your ankle. If you cannot reach your ankle or pant leg, loop a belt around your foot and grab the belt instead. Gently pull your heel toward your  buttocks. Your knee should not slide out to the side. You should feel a stretch in the front of your thigh and knee (quadriceps). Hold this position for __________ seconds. Repeat __________ times. Complete this exercise __________ times a day. Hamstring, supine  Lie on your back (supine position). Loop a belt or towel over the ball of your left / right foot. The ball of your foot is on the walking surface, right under your toes. Straighten your left / right knee and slowly pull on the belt to raise your leg until you feel a gentle stretch behind your knee (hamstring). Do not let your knee bend while you do this. Keep your other leg flat on the floor. Hold this position for __________ seconds. Repeat __________ times. Complete this exercise __________ times a day. Strengthening exercises These exercises build strength and endurance in your knee. Endurance is the ability to use your muscles for a long time, even after they get tired. Quadriceps, isometric This exercise strengthens the muscles in front of your thigh (quadriceps) without moving your knee joint (isometric). Lie on your back with your left / right leg extended and your other knee bent. Put a rolled towel or small pillow under your knee if told by your health care provider. Slowly tense the muscles in the front of your left / right thigh. You should see your kneecap slide up toward your hip or see increased dimpling just above the knee. This motion will push the back of the knee toward the floor.   For __________ seconds, hold the muscle as tight as you can without increasing your pain. Relax the muscles slowly and completely. Repeat __________ times. Complete this exercise __________ times a day. Straight leg raises This exercise strengthens the muscles in front of your thigh (quadriceps) and the muscles that move your hips (hip flexors). Lie on your back with your left / right leg extended and your other knee bent. Tense the  muscles in the front of your left / right thigh. You should see your kneecap slide up or see increased dimpling just above the knee. Your thigh may even shake a bit. Keep these muscles tight as you raise your leg 4-6 inches (10-15 cm) off the floor. Do not let your knee bend. Hold this position for __________ seconds. Keep these muscles tense as you lower your leg. Relax your muscles slowly and completely after each repetition. Repeat __________ times. Complete this exercise __________ times a day. Hamstring, isometric  Lie on your back on a firm surface. Bend your left / right knee about __________ degrees. Dig your left / right heel into the surface as if you are trying to pull it toward your buttocks. Tighten the muscles in the back of your thighs (hamstring) to "dig" as hard as you can without increasing any pain. Hold this position for __________ seconds. Release the tension gradually and allow your muscles to relax completely for __________ seconds after each repetition. Repeat __________ times. Complete this exercise __________ times a day. Hamstring curls If told by your health care provider, do this exercise while wearing ankle weights. Begin with __________lb / kg weights. Then increase the weight by 1 lb (0.5 kg) increments. Do not wear ankle weights that are more than __________lb / kg. Lie on your abdomen with your legs straight. Bend your left / right knee as far as you can without feeling pain. Keep your hips flat against the floor. Hold this position for __________ seconds. Slowly lower your leg to the starting position. Repeat __________ times. Complete this exercise __________ times a day. Squats This exercise strengthens the muscles in front of your thigh and knee (quadriceps). Stand in front of a table, with your feet and knees pointing straight ahead. You may rest your hands on the table for balance but not for support. Slowly bend your knees and lower your hips like you  are going to sit in a chair. Keep your weight over your heels, not over your toes. Keep your lower legs upright so they are parallel with the table legs. Do not let your hips go lower than your knees. Do not bend lower than told by your health care provider. If your knee pain increases, do not bend as low. Hold the squat position for __________ seconds. Slowly push with your legs to return to standing. Do not use your hands to pull yourself to standing. Repeat __________ times. Complete this exercise __________ times a day. Wall slides This exercise strengthens the muscles in front of your thigh and knee (quadriceps). Lean your back against a smooth wall or door, and walk your feet out 18-24 inches (46-61 cm) from it. Place your feet hip-width apart. Slowly slide down the wall or door until your knees bend __________ degrees. Keep your knees over your heels, not over your toes. Keep your knees in line with your hips. Hold this position for __________ seconds. Repeat __________ times. Complete this exercise __________ times a day. Straight leg raises, side-lying This exercise strengthens the muscles that rotate   the leg at the hip and move it away from your body (hip abductors). Lie on your side with your left / right leg in the top position. Lie so your head, shoulder, knee, and hip line up. You may bend your bottom knee to help you keep your balance. Roll your hips slightly forward so your hips are stacked directly over each other and your left / right knee is facing forward. Leading with your heel, lift your top leg 4-6 inches (10-15 cm). You should feel the muscles in your outer hip lifting. Do not let your foot drift forward. Do not let your knee roll toward the ceiling. Hold this position for __________ seconds. Slowly return your leg to the starting position. Let your muscles relax completely after each repetition. Repeat __________ times. Complete this exercise __________ times a  day. Straight leg raises, prone This exercise stretches the muscles that move your hips away from the front of the pelvis (hip extensors). Lie on your abdomen on a firm surface. You can put a pillow under your hips if that is more comfortable. Tense the muscles in your buttocks and lift your left / right leg about 4-6 inches (10-15 cm). Keep your knee straight as you lift your leg. Hold this position for __________ seconds. Slowly lower your leg to the starting position. Let your leg relax completely after each repetition. Repeat __________ times. Complete this exercise __________ times a day. This information is not intended to replace advice given to you by your health care provider. Make sure you discuss any questions you have with your health care provider. Document Revised: 02/11/2021 Document Reviewed: 02/11/2021 Elsevier Patient Education  2023 [employer] Inc[employer]  

## 2022-09-10 NOTE — Progress Notes (Signed)
Subjective:    Patient ID: Haley Morris, female    DOB: Mar 05, 1951, 72 y.o.   MRN: [id number]  HPI  Patient presents to clinic today with complaint of bilateral knee pain.  This is a chronic issue that has worsened in the last month.  She describes the pain as aching. The pain is constant. She reports her knees are giving out on her and she has had multiple falls because of this. She has been using a cane. She has not noticed any swelling. She takes Tylenol or Ibuprofen with minimal relief of symptoms. She reports a fall in 2022 in which she injured her knee but has never had any type of knee surgery.  Review of Systems     Past Medical History:  Diagnosis Date   Arthritis    knees    Current Outpatient Medications  Medication Sig Dispense Refill   acetaminophen (TYLENOL) 500 MG tablet Take 500 mg by mouth every 6 (six) hours as needed.     Ascorbic Acid (VITAMIN C PO) Take by mouth daily.     B Complex Vitamins (VITAMIN B COMPLEX PO) Take by mouth daily.     BOOSTRIX 5-2.5-18.5 LF-MCG/0.5 injection      Calcium Carb-Cholecalciferol (CALCIUM 1000 + D PO) Take by mouth.     CINNAMON PO Take by mouth daily.     ELDERBERRY PO Take by mouth.     Ginger, Zingiber officinalis, (GINGER ROOT) 550 MG CAPS Take by mouth.      ibuprofen (ADVIL) 400 MG tablet Take by mouth.     Multiple Vitamin (MULTIVITAMIN) tablet Take 1 tablet by mouth daily.     oxyCODONE (ROXICODONE) 5 MG immediate release tablet Take 1 tablet (5 mg total) by mouth every 6 (six) hours as needed for severe pain. (Patient not taking: Reported on 05/11/2022) 10 tablet 0   TURMERIC PO Take by mouth daily.     No current facility-administered medications for this visit.    Allergies  Allergen Reactions   Cefdinir Swelling    "throat closes"   Erythromycin     Cousin is allergic   Hydromet [Hydrocodone Bit-Homatrop Mbr] Nausea And Vomiting    Family History  Problem Relation Age of Onset   Heart attack Father     Lung cancer Brother     Social History   Socioeconomic History   Marital status: Widowed    Spouse name: Not on file   Number of children: 1   Years of education: Not on file   Highest education level: Not on file  Occupational History   Occupation: retired   Tobacco Use   Smoking status: Former   Smokeless tobacco: Never   Tobacco comments:    socially in her 24s  Vaping Use   Vaping Use: Never used  Substance and Sexual Activity   Alcohol use: Not Currently   Drug use: Not Currently   Sexual activity: Not Currently  Other Topics Concern   Not on file  Social History Narrative   Not on file   Social Determinants of Health   Financial Resource Strain: [place]  (05/11/2022)   Overall Financial Resource Strain (CARDIA)    Difficulty of Paying Living Expenses: Not hard at all  Food Insecurity: No Food Insecurity (05/11/2022)   Hunger Vital Sign    Worried About Running Out of Food in the Last Year: Never true    [place] in the Last Year: Never true  Transportation  Needs: No Transportation Needs (05/11/2022)   PRAPARE - Hydrologist (Medical): No    Lack of Transportation (Non-Medical): No  Physical Activity: Insufficiently Active (05/11/2022)   Exercise Vital Sign    Days of Exercise per Week: 2 days    Minutes of Exercise per Session: 10 min  Stress: No Stress Concern Present (05/11/2022)   [place]    Feeling of Stress : Not at all  Social Connections: Moderately Integrated (05/11/2022)   Social Connection and Isolation Panel [NHANES]    Frequency of Communication with Friends and Family: More than three times a week    Frequency of Social Gatherings with Friends and Family: More than three times a week    Attends Religious Services: More than 4 times per year    Active Member of [employer] or Organizations: Yes    Attends Archivist Meetings: More than 4  times per year    Marital Status: Widowed  Intimate Partner Violence: Not At Risk (05/11/2022)   Humiliation, Afraid, Rape, and Kick questionnaire    Fear of Current or Ex-Partner: No    Emotionally Abused: No    Physically Abused: No    Sexually Abused: No     Constitutional: Denies fever, malaise, fatigue, headache or abrupt weight changes.  Respiratory: Denies difficulty breathing, shortness of breath, cough or sputum production.   Cardiovascular: Denies chest pain, chest tightness, palpitations or swelling in the hands or feet.  Musculoskeletal: Patient reports bilateral knee pain, difficulty with gait and frequent falls.  Denies decrease in range of motion, muscle pain or joint swelling.  Neurological: Denies numbness, tingling or problems with balance and coordination.    No other specific complaints in a complete review of systems (except as listed in HPI above).  Objective:   Physical Exam BP 122/80 (BP Location: Right Arm, Patient Position: Sitting)   Pulse 81   Ht 5\' 2"  (1.575 m)   Wt 203 lb 3.2 oz (92.2 kg)   SpO2 99%   BMI 37.17 kg/m   Wt Readings from Last 3 Encounters:  05/11/22 211 lb (95.7 kg)  05/11/22 211 lb (95.7 kg)  12/12/21 227 lb (103 kg)    General: Appears her stated age, obese, in NAD. Cardiovascular: Normal rate and rhythm. S1,S2 noted.  No murmur, rubs or gallops noted.  Pulmonary/Chest: Normal effort and positive vesicular breath sounds. No respiratory distress. No wheezes, rales or ronchi noted.  Musculoskeletal: Normal flexion and extension of the left knee.  Normal flexion of the right knee but decreased extension.  Unable to tell if she has joint enlargement because of the excess subcutaneous fat in the thighs.  Pain with palpation along bilateral medial joint lines.  No distinct joint effusion noted.  She has difficulty getting from a sitting to a standing position.  Gait is slow and slightly unsteady with the use of a cane. Neurological:  Alert and oriented.     BMET    Component Value Date/Time   NA 139 05/11/2022 1558   K 4.2 05/11/2022 1558   CL 104 05/11/2022 1558   CO2 24 05/11/2022 1558   GLUCOSE 132 (H) 05/11/2022 1558   BUN 21 05/11/2022 1558   CREATININE 0.81 05/11/2022 1558   CALCIUM 10.2 05/11/2022 1558   GFRNONAA 82 01/18/2020 1033   GFRAA 95 01/18/2020 1033    Lipid Panel     Component Value Date/Time   CHOL 261 (H)  05/11/2022 1558   TRIG 156 (H) 05/11/2022 1558   HDL 56 05/11/2022 1558   CHOLHDL 4.7 05/11/2022 1558   LDLCALC 175 (H) 05/11/2022 1558    CBC    Component Value Date/Time   WBC 7.5 05/11/2022 1558   RBC 4.54 05/11/2022 1558   HGB 13.9 05/11/2022 1558   HCT 40.7 05/11/2022 1558   PLT 309 05/11/2022 1558   MCV 89.6 05/11/2022 1558   MCH 30.6 05/11/2022 1558   MCHC 34.2 05/11/2022 1558   RDW 12.2 05/11/2022 1558   LYMPHSABS 2,240 01/18/2020 1033   EOSABS 152 01/18/2020 1033   BASOSABS 32 01/18/2020 1033    Hgb A1C Lab Results  Component Value Date   HGBA1C 6.3 (H) 05/11/2022             Assessment & Plan:   Chronic Bilateral Knee Pain:  X-ray bilateral knees today Offered Rx for prednisone but she declines at this time Encouraged her to wear knee braces that can offer support especially while ambulating Discussed likely referral to Ortho pending x-ray results  RTC in 2 months for follow-up of chronic conditions Webb Silversmith, NP

## 2022-09-11 ENCOUNTER — Other Ambulatory Visit: Payer: Self-pay | Admitting: Internal Medicine

## 2022-09-11 DIAGNOSIS — Z1231 Encounter for screening mammogram for malignant neoplasm of breast: Secondary | ICD-10-CM

## 2022-09-14 ENCOUNTER — Telehealth: Payer: Self-pay

## 2022-09-14 DIAGNOSIS — G8929 Other chronic pain: Secondary | ICD-10-CM

## 2022-09-14 NOTE — Telephone Encounter (Signed)
Patient called back and asked for a clear understanding of what arthritic changes mean. Advised arthritis and the cartilage between the spaces of the knee is gone to both knees, so basically bone on bone. She verbalized understanding and asked again if the referral would be done as soon as possible, advised of the process. She verbalized understanding.

## 2022-09-14 NOTE — Telephone Encounter (Signed)
Pt given x-ray results per notes of Webb Silversmith, NP on 09/13/22. Pt verbalized understanding.She's agreeable to orhopedic referral and asked to be sent to one who takes her insurance. Advised it could take up to 2 weeks and she will hear from the orthopedic office of the appointment when it's made.   Jearld Fenton, NP 09/13/2022  4:06 PM EDT     X-ray of the right knee shows marked arthritic changes with loss of the joint space.  I think she would benefit from orthopedic referral.  Please let me know if she is agreeable with this.   Jearld Fenton, NP 09/13/2022  4:07 PM EDT     X-ray of the left knee shows severe arthritic changes with loss of joint space.  I think she should follow-up with orthopedics for this.  Please let me know if she is agreeable to referral.

## 2022-09-15 NOTE — Telephone Encounter (Signed)
Pt is returning Laura's call. Stated has already spoken with the nurse and got her x-ray results. Pt is requesting a callback from Mickel Baas would like an update on her referral and stated she has some questions as well.   Please advise.

## 2022-09-15 NOTE — Telephone Encounter (Signed)
Referral to orthopedics placed.

## 2022-09-15 NOTE — Addendum Note (Signed)
Addended by: Jearld Fenton on: 09/15/2022 02:02 PM   Modules accepted: Orders

## 2022-09-15 NOTE — Telephone Encounter (Signed)
Re-routing this back to Clinical Pool & to PCP [facility] like she received the updated information on her x-ray results and agrees to Ortho referral. She called back it seems earlier today 09/15/22 with questions and to get an update on referral.  As of now, I don't see Ortho referral order in system yet.  I am happy to assist if she has a place she prefers to go to [facility] vs [place].  Also, can let me know what questions she still has - and I can try to help answer.  This does not seem like an urgent care. If Webb Silversmith, FNP is able to place referral when she is next available that could be fine as well.  Nobie Putnam, [facility] 09/15/2022, 12:23 PM

## 2022-09-17 DIAGNOSIS — M170 Bilateral primary osteoarthritis of knee: Secondary | ICD-10-CM | POA: Diagnosis not present

## 2022-09-30 ENCOUNTER — Ambulatory Visit
Admission: RE | Admit: 2022-09-30 | Discharge: 2022-09-30 | Disposition: A | Discharge status: Home / Self Care | Payer: PPO | Source: Ambulatory Visit | Attending: Internal Medicine | Admitting: Internal Medicine

## 2022-09-30 DIAGNOSIS — Z1231 Encounter for screening mammogram for malignant neoplasm of breast: Secondary | ICD-10-CM | POA: Diagnosis not present

## 2022-11-10 ENCOUNTER — Ambulatory Visit (INDEPENDENT_AMBULATORY_CARE_PROVIDER_SITE_OTHER): Payer: PPO | Admitting: Internal Medicine

## 2022-11-10 ENCOUNTER — Encounter: Payer: Self-pay | Admitting: Internal Medicine

## 2022-11-10 VITALS — BP 138/86 | HR 94 | Temp 96.8°F | Wt 201.0 lb

## 2022-11-10 DIAGNOSIS — E6609 Other obesity due to excess calories: Secondary | ICD-10-CM

## 2022-11-10 DIAGNOSIS — M810 Age-related osteoporosis without current pathological fracture: Secondary | ICD-10-CM

## 2022-11-10 DIAGNOSIS — E782 Mixed hyperlipidemia: Secondary | ICD-10-CM | POA: Diagnosis not present

## 2022-11-10 DIAGNOSIS — E66812 Obesity, class 2: Secondary | ICD-10-CM

## 2022-11-10 DIAGNOSIS — I10 Essential (primary) hypertension: Secondary | ICD-10-CM | POA: Diagnosis not present

## 2022-11-10 DIAGNOSIS — Z1211 Encounter for screening for malignant neoplasm of colon: Secondary | ICD-10-CM | POA: Diagnosis not present

## 2022-11-10 DIAGNOSIS — R7303 Prediabetes: Secondary | ICD-10-CM | POA: Diagnosis not present

## 2022-11-10 DIAGNOSIS — M170 Bilateral primary osteoarthritis of knee: Secondary | ICD-10-CM

## 2022-11-10 DIAGNOSIS — Z6836 Body mass index (BMI) 36.0-36.9, adult: Secondary | ICD-10-CM

## 2022-11-10 NOTE — Assessment & Plan Note (Signed)
Encouraged diet and exercise for weight loss ?

## 2022-11-10 NOTE — Assessment & Plan Note (Signed)
Controlled off meds Encouraged DASH diet and exercise for weight loss

## 2022-11-10 NOTE — Progress Notes (Signed)
Subjective:    Patient ID: Haley Morris, female    DOB: 03-08-1951, 72 y.o.   MRN: [id number]  HPI  Patient presents to clinic today for 57-month follow-up of chronic conditions.  OA: Mainly in her back and knees, L >R.  She takes Tylenol and Turmeric OTC with some relief of symptoms.  She follows with orthopedics.  HTN: Her BP today is 138/86.  She is not currently taking any antihypertensive medications at this time.  There is no ECG on file.  HLD: Her last LDL was 175, triglycerides 156, 04/2022.  She is not taking any cholesterol-lowering medication at this time.  She does not consume low-fat diet.  Prediabetes: Her last A1c was 6.3, 04/2022.  She is taking cinnamon OTC but is not taking any prescription oral diabetic medications time.  She does not check her sugars.  Osteoporosis: She is taking Calcium and Vitamin D OTC.  She does not get daily weightbearing exercise.  Review of Systems     Past Medical History:  Diagnosis Date   Arthritis    knees    Current Outpatient Medications  Medication Sig Dispense Refill   acetaminophen (TYLENOL) 500 MG tablet Take 500 mg by mouth every 6 (six) hours as needed.     Ascorbic Acid (VITAMIN C PO) Take by mouth daily.     B Complex Vitamins (VITAMIN B COMPLEX PO) Take by mouth daily.     Calcium Carb-Cholecalciferol (CALCIUM 1000 + D PO) Take by mouth.     CINNAMON PO Take by mouth daily.     ELDERBERRY PO Take by mouth.     Ginger, Zingiber officinalis, (GINGER ROOT) 550 MG CAPS Take by mouth.      ibuprofen (ADVIL) 400 MG tablet Take by mouth.     Multiple Vitamin (MULTIVITAMIN) tablet Take 1 tablet by mouth daily.     TURMERIC PO Take by mouth daily.     No current facility-administered medications for this visit.    Allergies  Allergen Reactions   Cefdinir Swelling    "throat closes"   Erythromycin     Cousin is allergic   Hydromet [Hydrocodone Bit-Homatrop Mbr] Nausea And Vomiting    Family History  Problem  Relation Age of Onset   Heart attack Father    Lung cancer Brother     Social History   Socioeconomic History   Marital status: Widowed    Spouse name: Not on file   Number of children: 1   Years of education: Not on file   Highest education level: Not on file  Occupational History   Occupation: retired   Tobacco Use   Smoking status: Former   Smokeless tobacco: Never   Tobacco comments:    socially in her 71s  Vaping Use   Vaping Use: Never used  Substance and Sexual Activity   Alcohol use: Not Currently   Drug use: Not Currently   Sexual activity: Not Currently  Other Topics Concern   Not on file  Social History Narrative   Not on file   Social Determinants of Health   Financial Resource Strain: Low Risk  (05/11/2022)   Overall Financial Resource Strain (CARDIA)    Difficulty of Paying Living Expenses: Not hard at all  Food Insecurity: No Food Insecurity (05/11/2022)   Hunger Vital Sign    Worried About Running Out of Food in the Last Year: Never true    Ran Out of Food in the Last Year: Never true  Transportation Needs: No Transportation Needs (05/11/2022)   PRAPARE - Administrator, Civil Service (Medical): No    Lack of Transportation (Non-Medical): No  Physical Activity: Insufficiently Active (05/11/2022)   Exercise Vital Sign    Days of Exercise per Week: 2 days    Minutes of Exercise per Session: 10 min  Stress: No Stress Concern Present (05/11/2022)   [employer] of Occupational Health - Occupational Stress Questionnaire    Feeling of Stress : Not at all  Social Connections: Moderately Integrated (05/11/2022)   Social Connection and Isolation Panel [NHANES]    Frequency of Communication with Friends and Family: More than three times a week    Frequency of Social Gatherings with Friends and Family: More than three times a week    Attends Religious Services: More than 4 times per year    Active Member of [employer] or Organizations: Yes     Attends Banker Meetings: More than 4 times per year    Marital Status: Widowed  Intimate Partner Violence: Not At Risk (05/11/2022)   Humiliation, Afraid, Rape, and Kick questionnaire    Fear of Current or Ex-Partner: No    Emotionally Abused: No    Physically Abused: No    Sexually Abused: No     Constitutional: Denies fever, malaise, fatigue, headache or abrupt weight changes.  HEENT: Denies eye pain, eye redness, ear pain, ringing in the ears, wax buildup, runny nose, nasal congestion, bloody nose, or sore throat. Respiratory: Denies difficulty breathing, shortness of breath, cough or sputum production.   Cardiovascular: Denies chest pain, chest tightness, palpitations or swelling in the hands or feet.  Gastrointestinal: Denies abdominal pain, bloating, constipation, diarrhea or blood in the stool.  GU: Denies urgency, frequency, pain with urination, burning sensation, blood in urine, odor or discharge. Musculoskeletal: Patient reports knee pain, difficulty with gait.  Denies decrease in range of motion, muscle pain or joint swelling.  Skin: Denies redness, rashes, lesions or ulcercations.  Neurological: Denies dizziness, difficulty with memory, difficulty with speech or problems with balance and coordination.  Psych: Denies anxiety, depression, SI/HI.  No other specific complaints in a complete review of systems (except as listed in HPI above).  Objective:   Physical Exam  BP 138/86 (BP Location: Left Arm, Patient Position: Sitting, Cuff Size: Normal)   Pulse 94   Temp (!) 96.8 F (36 C) (Temporal)   Wt 201 lb (91.2 kg)   SpO2 95%   BMI 36.76 kg/m   Wt Readings from Last 3 Encounters:  09/10/22 203 lb 3.2 oz (92.2 kg)  05/11/22 211 lb (95.7 kg)  05/11/22 211 lb (95.7 kg)    General: Appears her stated age, obese, in NAD. Skin: Warm, dry and intact.  HEENT: Head: normal shape and size;  Cardiovascular: Normal rate and rhythm. S1,S2 noted.  No murmur, rubs  or gallops noted. No JVD or BLE edema. No carotid bruits noted. Pulmonary/Chest: Normal effort and positive vesicular breath sounds. No respiratory distress. No wheezes, rales or ronchi noted.  Musculoskeletal: Joint enlargement noted of bilateral knees. Limping gait with use of cane. Neurological: Alert and oriented. Coordination normal.  Psychiatric: Mood and affect normal. Behavior is normal. Judgment and thought content normal.     BMET    Component Value Date/Time   NA 139 05/11/2022 1558   K 4.2 05/11/2022 1558   CL 104 05/11/2022 1558   CO2 24 05/11/2022 1558   GLUCOSE 132 (H) 05/11/2022 1558   BUN  21 05/11/2022 1558   CREATININE 0.81 05/11/2022 1558   CALCIUM 10.2 05/11/2022 1558   GFRNONAA 82 01/18/2020 1033   GFRAA 95 01/18/2020 1033    Lipid Panel     Component Value Date/Time   CHOL 261 (H) 05/11/2022 1558   TRIG 156 (H) 05/11/2022 1558   HDL 56 05/11/2022 1558   CHOLHDL 4.7 05/11/2022 1558   LDLCALC 175 (H) 05/11/2022 1558    CBC    Component Value Date/Time   WBC 7.5 05/11/2022 1558   RBC 4.54 05/11/2022 1558   HGB 13.9 05/11/2022 1558   HCT 40.7 05/11/2022 1558   PLT 309 05/11/2022 1558   MCV 89.6 05/11/2022 1558   MCH 30.6 05/11/2022 1558   MCHC 34.2 05/11/2022 1558   RDW 12.2 05/11/2022 1558   LYMPHSABS 2,240 01/18/2020 1033   EOSABS 152 01/18/2020 1033   BASOSABS 32 01/18/2020 1033    Hgb A1C Lab Results  Component Value Date   HGBA1C 6.3 (H) 05/11/2022            Assessment & Plan:      RTC in 6 months for your annual exam Nicki Reaper, NP

## 2022-11-10 NOTE — Assessment & Plan Note (Signed)
Continue Calcium and Vit D OTC Encouraged daily weight bearing exercise 

## 2022-11-10 NOTE — Assessment & Plan Note (Signed)
CMET and lipid profile today Encouraged her to consume a low fat diet 

## 2022-11-10 NOTE — Assessment & Plan Note (Signed)
A1C today Encouraged low carb diet and exercise for weight loss  

## 2022-11-10 NOTE — Patient Instructions (Signed)

## 2022-11-10 NOTE — Assessment & Plan Note (Signed)
Encouraged weight loss as this can reduce joint pain Continue Tylenol and Tumeric

## 2022-11-11 LAB — CBC
HCT: 38.9 % (ref 35.0–45.0)
Hemoglobin: 13.4 g/dL (ref 11.7–15.5)
MCH: 30.9 pg (ref 27.0–33.0)
MCHC: 34.4 g/dL (ref 32.0–36.0)
MCV: 89.6 fL (ref 80.0–100.0)
MPV: 10.9 fL (ref 7.5–12.5)
Platelets: 301 10*3/uL (ref 140–400)
RBC: 4.34 10*6/uL (ref 3.80–5.10)
RDW: 12.5 % (ref 11.0–15.0)
WBC: 7.5 10*3/uL (ref 3.8–10.8)

## 2022-11-11 LAB — LIPID PANEL
Cholesterol: 260 mg/dL — ABNORMAL HIGH (ref <200)
HDL: 56 mg/dL (ref >=50)
LDL Cholesterol (Calc): 178 mg/dL (calc) — ABNORMAL HIGH
Non-HDL Cholesterol (Calc): 204 mg/dL (calc) — ABNORMAL HIGH (ref <130)
Total CHOL/HDL Ratio: 4.6 (calc) (ref <5.0)
Triglycerides: 127 mg/dL (ref <150)

## 2022-11-11 LAB — COMPLETE METABOLIC PANEL WITH GFR
AG Ratio: 1.5 (calc) (ref 1.0–2.5)
ALT: 17 U/L (ref 6–29)
AST: 19 U/L (ref 10–35)
Albumin: 4.3 g/dL (ref 3.6–5.1)
Alkaline phosphatase (APISO): 71 U/L (ref 37–153)
BUN: 19 mg/dL (ref 7–25)
CO2: 22 mmol/L (ref 20–32)
Calcium: 9.7 mg/dL (ref 8.6–10.4)
Chloride: 106 mmol/L (ref 98–110)
Creat: 0.83 mg/dL (ref 0.60–1.00)
Globulin: 2.9 g/dL (calc) (ref 1.9–3.7)
Glucose, Bld: 118 mg/dL — ABNORMAL HIGH (ref 65–99)
Potassium: 4.1 mmol/L (ref 3.5–5.3)
Sodium: 140 mmol/L (ref 135–146)
Total Bilirubin: 0.9 mg/dL (ref 0.2–1.2)
Total Protein: 7.2 g/dL (ref 6.1–8.1)
eGFR: 75 mL/min/{1.73_m2} (ref >=60)

## 2022-11-11 LAB — HEMOGLOBIN A1C
Hgb A1c MFr Bld: 5.9 % of total Hgb — ABNORMAL HIGH (ref <5.7)
Mean Plasma Glucose: 123 mg/dL
eAG (mmol/L): 6.8 mmol/L

## 2022-11-12 NOTE — Addendum Note (Signed)
Addended by: Lorre Munroe on: 11/12/2022 01:55 PM   Modules accepted: Orders

## 2022-11-24 DIAGNOSIS — Z1211 Encounter for screening for malignant neoplasm of colon: Secondary | ICD-10-CM | POA: Diagnosis not present

## 2022-11-24 DIAGNOSIS — Z0279 Encounter for issue of other medical certificate: Secondary | ICD-10-CM

## 2022-11-30 LAB — COLOGUARD: COLOGUARD: NEGATIVE

## 2022-12-01 ENCOUNTER — Telehealth: Payer: Self-pay | Admitting: *Deleted

## 2022-12-01 ENCOUNTER — Telehealth: Payer: Self-pay

## 2022-12-01 NOTE — Telephone Encounter (Signed)
L:eft message for patient to call back to discuss results

## 2022-12-01 NOTE — Telephone Encounter (Signed)
-----   Message from Lorre Munroe, NP sent at 12/01/2022  7:25 AM EDT ----- Negative Cologuard.  We can repeat this in 3 years.

## 2022-12-01 NOTE — Telephone Encounter (Signed)
  Chief Complaint: Results Symptoms: NA Frequency: NA Pertinent Negatives: Patient denies NA Disposition: [] ED /[] Urgent Care (no appt availability in office) / [] Appointment(In office/virtual)/ []  [facility]/ [] Home Care/ [] Refused Recommended Disposition /[] [facility]/ []  Follow-up with PCP Additional Notes:  Called back for  Cologard results:  Negative Cologuard.  We can repeat this in 3 years.   Verbalizes understanding.

## 2022-12-24 DIAGNOSIS — M170 Bilateral primary osteoarthritis of knee: Secondary | ICD-10-CM | POA: Diagnosis not present

## 2023-01-15 DIAGNOSIS — H43813 Vitreous degeneration, bilateral: Secondary | ICD-10-CM | POA: Diagnosis not present

## 2023-01-15 DIAGNOSIS — H26492 Other secondary cataract, left eye: Secondary | ICD-10-CM | POA: Diagnosis not present

## 2023-01-15 DIAGNOSIS — Z961 Presence of intraocular lens: Secondary | ICD-10-CM | POA: Diagnosis not present

## 2023-01-15 DIAGNOSIS — H5000 Unspecified esotropia: Secondary | ICD-10-CM | POA: Diagnosis not present

## 2023-03-22 DIAGNOSIS — M170 Bilateral primary osteoarthritis of knee: Secondary | ICD-10-CM | POA: Diagnosis not present

## 2023-03-29 DIAGNOSIS — Z0189 Encounter for other specified special examinations: Secondary | ICD-10-CM | POA: Diagnosis not present

## 2023-03-29 DIAGNOSIS — M170 Bilateral primary osteoarthritis of knee: Secondary | ICD-10-CM | POA: Diagnosis not present

## 2023-04-26 DIAGNOSIS — E669 Obesity, unspecified: Secondary | ICD-10-CM | POA: Diagnosis not present

## 2023-04-30 DIAGNOSIS — Z0189 Encounter for other specified special examinations: Secondary | ICD-10-CM | POA: Diagnosis not present

## 2023-05-19 ENCOUNTER — Encounter: Payer: PPO | Admitting: Internal Medicine

## 2023-05-20 DIAGNOSIS — G8918 Other acute postprocedural pain: Secondary | ICD-10-CM | POA: Diagnosis not present

## 2023-05-20 DIAGNOSIS — E785 Hyperlipidemia, unspecified: Secondary | ICD-10-CM | POA: Diagnosis not present

## 2023-05-20 DIAGNOSIS — Z7982 Long term (current) use of aspirin: Secondary | ICD-10-CM | POA: Diagnosis not present

## 2023-05-20 DIAGNOSIS — M25761 Osteophyte, right knee: Secondary | ICD-10-CM | POA: Diagnosis not present

## 2023-05-20 DIAGNOSIS — G8929 Other chronic pain: Secondary | ICD-10-CM | POA: Diagnosis not present

## 2023-05-20 DIAGNOSIS — M1711 Unilateral primary osteoarthritis, right knee: Secondary | ICD-10-CM | POA: Diagnosis not present

## 2023-05-20 DIAGNOSIS — Z23 Encounter for immunization: Secondary | ICD-10-CM | POA: Diagnosis not present

## 2023-05-20 DIAGNOSIS — M21961 Unspecified acquired deformity of right lower leg: Secondary | ICD-10-CM | POA: Diagnosis not present

## 2023-05-21 DIAGNOSIS — M1711 Unilateral primary osteoarthritis, right knee: Secondary | ICD-10-CM | POA: Diagnosis not present

## 2023-05-21 DIAGNOSIS — E785 Hyperlipidemia, unspecified: Secondary | ICD-10-CM | POA: Diagnosis not present

## 2023-05-21 DIAGNOSIS — Z96651 Presence of right artificial knee joint: Secondary | ICD-10-CM | POA: Diagnosis not present

## 2023-05-27 DIAGNOSIS — M25561 Pain in right knee: Secondary | ICD-10-CM | POA: Diagnosis not present

## 2023-06-02 DIAGNOSIS — M25561 Pain in right knee: Secondary | ICD-10-CM | POA: Diagnosis not present

## 2023-06-04 DIAGNOSIS — M25561 Pain in right knee: Secondary | ICD-10-CM | POA: Diagnosis not present

## 2023-06-07 DIAGNOSIS — M25561 Pain in right knee: Secondary | ICD-10-CM | POA: Diagnosis not present

## 2023-06-11 DIAGNOSIS — M25561 Pain in right knee: Secondary | ICD-10-CM | POA: Diagnosis not present

## 2023-06-14 DIAGNOSIS — M25561 Pain in right knee: Secondary | ICD-10-CM | POA: Diagnosis not present

## 2023-06-18 DIAGNOSIS — M25561 Pain in right knee: Secondary | ICD-10-CM | POA: Diagnosis not present

## 2023-06-21 DIAGNOSIS — M25561 Pain in right knee: Secondary | ICD-10-CM | POA: Diagnosis not present

## 2023-06-25 DIAGNOSIS — M25561 Pain in right knee: Secondary | ICD-10-CM | POA: Diagnosis not present

## 2023-07-05 DIAGNOSIS — M25561 Pain in right knee: Secondary | ICD-10-CM | POA: Diagnosis not present

## 2023-07-09 DIAGNOSIS — M25561 Pain in right knee: Secondary | ICD-10-CM | POA: Diagnosis not present

## 2023-07-12 DIAGNOSIS — M25561 Pain in right knee: Secondary | ICD-10-CM | POA: Diagnosis not present

## 2023-07-12 DIAGNOSIS — Z96651 Presence of right artificial knee joint: Secondary | ICD-10-CM | POA: Diagnosis not present

## 2023-07-12 DIAGNOSIS — Z4889 Encounter for other specified surgical aftercare: Secondary | ICD-10-CM | POA: Diagnosis not present

## 2023-07-15 DIAGNOSIS — M25561 Pain in right knee: Secondary | ICD-10-CM | POA: Diagnosis not present

## 2023-07-29 DIAGNOSIS — M25561 Pain in right knee: Secondary | ICD-10-CM | POA: Diagnosis not present

## 2023-08-02 DIAGNOSIS — M25561 Pain in right knee: Secondary | ICD-10-CM | POA: Diagnosis not present

## 2023-08-06 DIAGNOSIS — M25561 Pain in right knee: Secondary | ICD-10-CM | POA: Diagnosis not present

## 2023-08-09 DIAGNOSIS — M25561 Pain in right knee: Secondary | ICD-10-CM | POA: Diagnosis not present

## 2023-08-13 DIAGNOSIS — M25561 Pain in right knee: Secondary | ICD-10-CM | POA: Diagnosis not present

## 2023-08-18 DIAGNOSIS — M25561 Pain in right knee: Secondary | ICD-10-CM | POA: Diagnosis not present

## 2023-08-26 DIAGNOSIS — M25561 Pain in right knee: Secondary | ICD-10-CM | POA: Diagnosis not present

## 2023-09-27 DIAGNOSIS — Z96651 Presence of right artificial knee joint: Secondary | ICD-10-CM | POA: Diagnosis not present

## 2023-09-27 DIAGNOSIS — M1712 Unilateral primary osteoarthritis, left knee: Secondary | ICD-10-CM | POA: Diagnosis not present

## 2023-10-21 ENCOUNTER — Encounter: Admitting: Internal Medicine

## 2023-11-11 ENCOUNTER — Other Ambulatory Visit: Payer: Self-pay | Admitting: Internal Medicine

## 2023-11-11 DIAGNOSIS — Z1231 Encounter for screening mammogram for malignant neoplasm of breast: Secondary | ICD-10-CM

## 2023-11-22 ENCOUNTER — Encounter: Admitting: Internal Medicine

## 2023-11-22 ENCOUNTER — Telehealth: Payer: Self-pay

## 2023-11-22 NOTE — Telephone Encounter (Signed)
 Copied from CRM [id number]. Topic: Appointments - Appointment Cancel/Reschedule >> Nov 22, 2023 10:05 AM Elle L wrote: The patient cancelled her appointment today due to having a stomach bug and uncontrolled diarrhea but declined Nurse Triage.

## 2023-11-25 ENCOUNTER — Encounter

## 2023-12-06 IMAGING — MG MM DIGITAL SCREENING BILAT W/ TOMO AND CAD
8 series · 8 of 24 positions shown · non-contrast
Comparison: Previous exam(s).

CLINICAL DATA: Screening.

EXAM:
DIGITAL SCREENING BILATERAL MAMMOGRAM WITH TOMOSYNTHESIS AND CAD
TECHNIQUE: Bilateral screening digital craniocaudal and mediolateral oblique
mammograms were obtained. Bilateral screening digital breast
tomosynthesis was performed. The images were evaluated with
computer-aided detection.

[R MLO synth-2D]
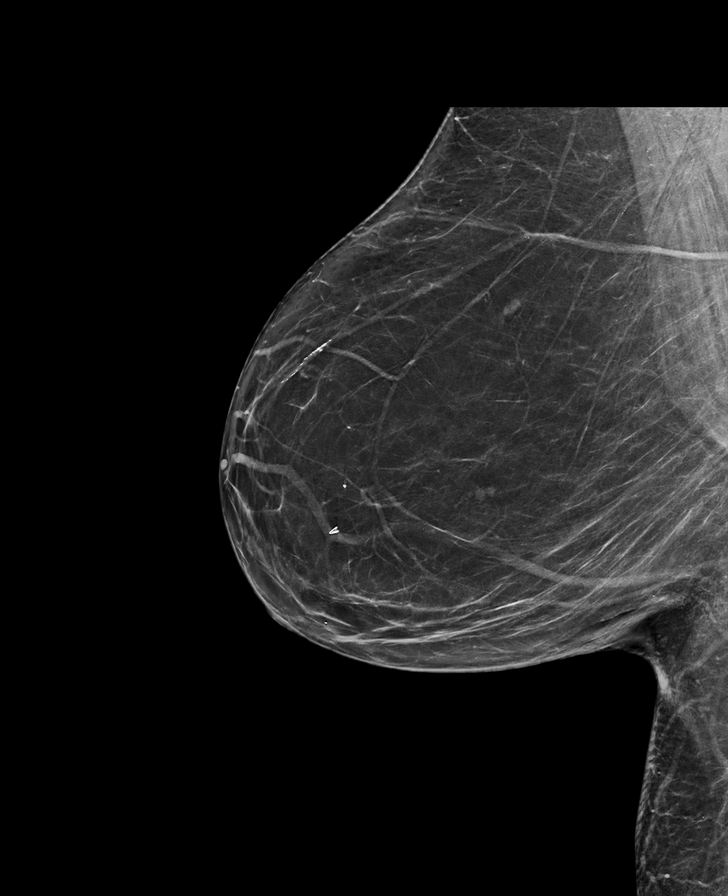

[L MLO synth-2D]
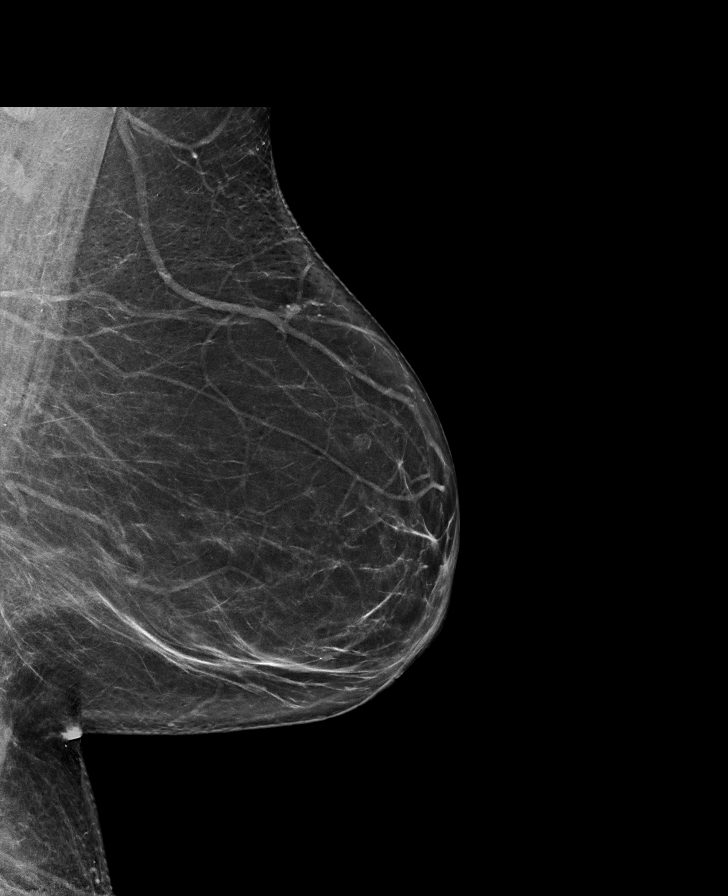

[R CC synth-2D]
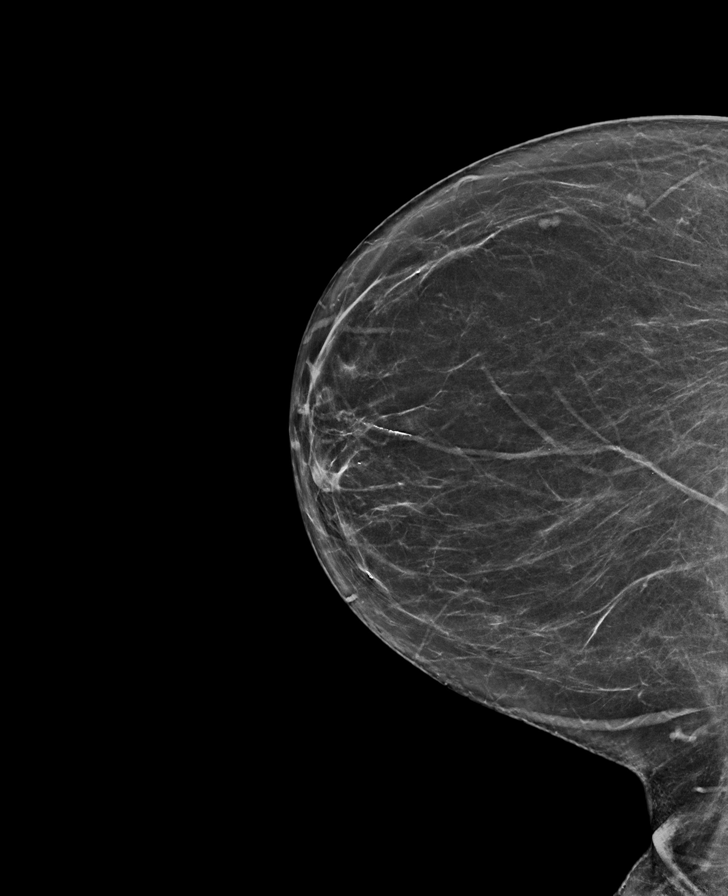

[L CC synth-2D]
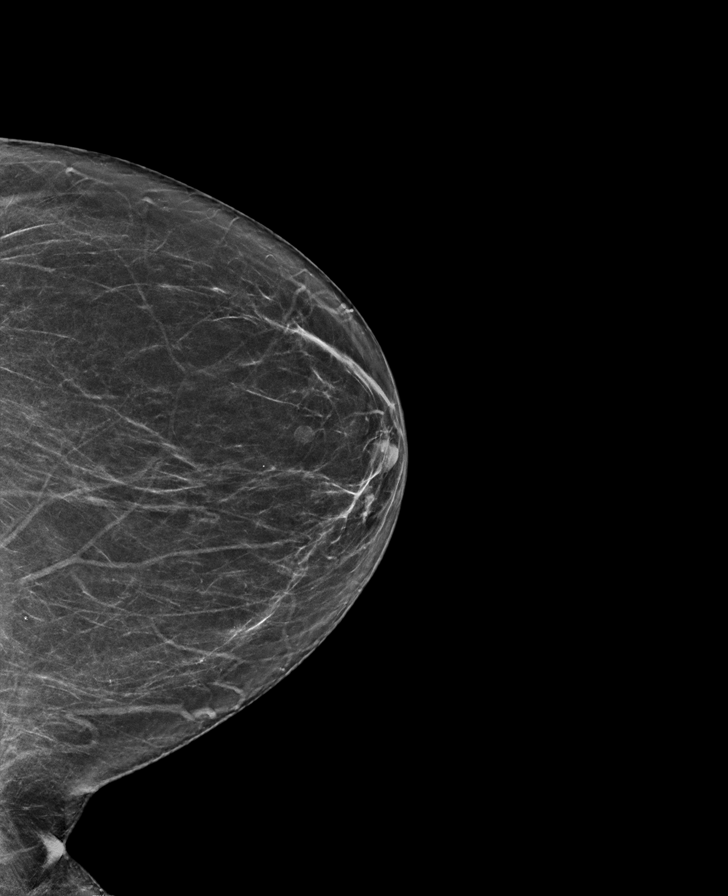

[R CC tomo · tomo slice 34/67.0]
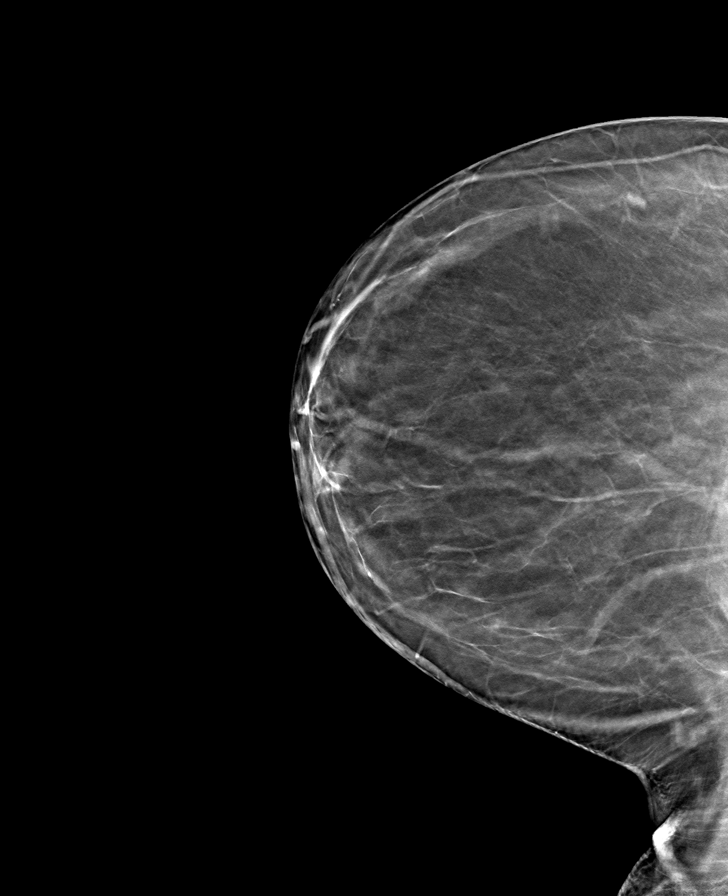

[L CC tomo · tomo slice 33/66.0]
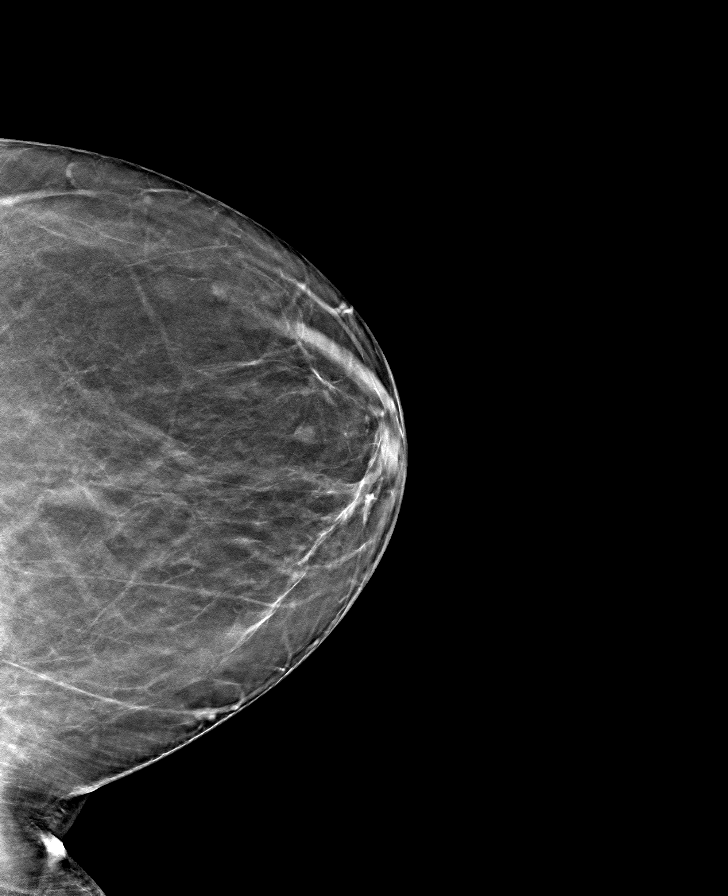

[R MLO tomo · tomo slice 39/76.0]
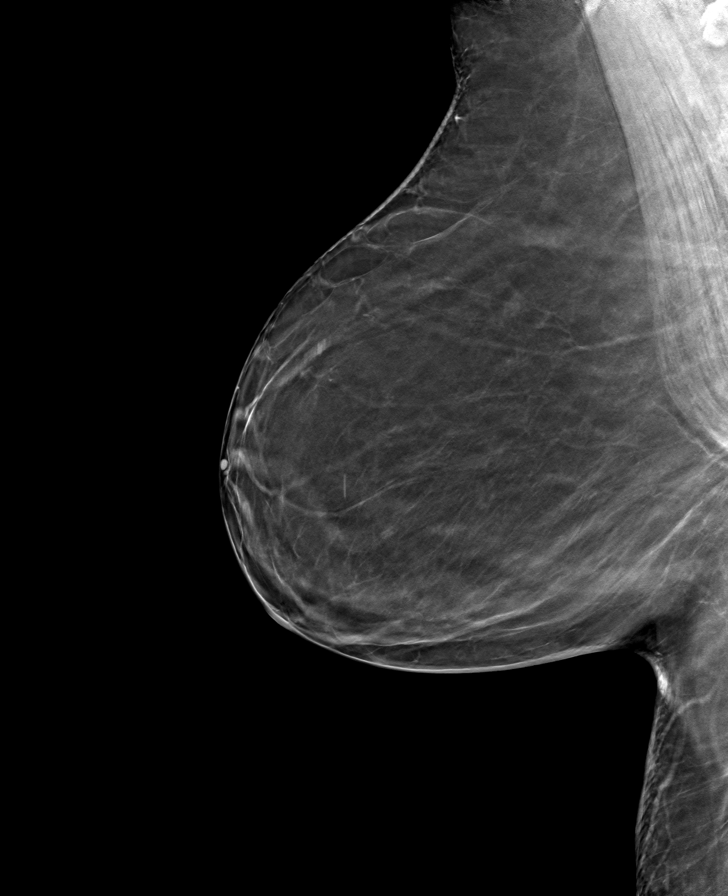

[L MLO tomo · tomo slice 37/74.0]
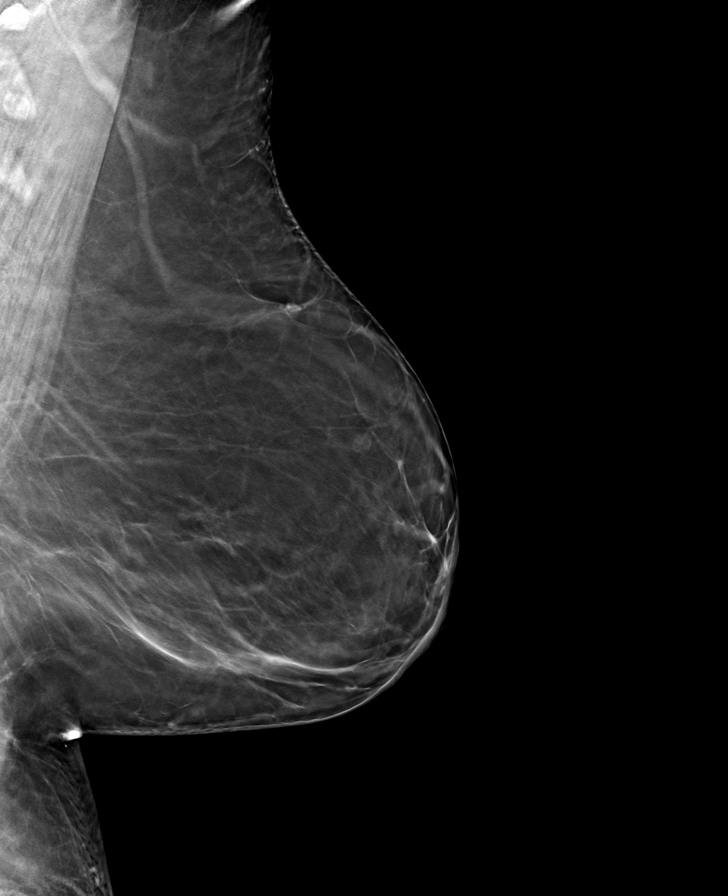

[8 of 24 positions shown; findings below may reference images not displayed]

ACR Breast Density Category b: There are scattered areas of
fibroglandular density.
FINDINGS: There are no findings suspicious for malignancy.
IMPRESSION: No mammographic evidence of malignancy. A result letter of this
screening mammogram will be mailed directly to the patient.

RECOMMENDATION:
Screening mammogram in one year. (Code:51-O-LD2)

BI-RADS CATEGORY  1: Negative.

## 2023-12-13 DIAGNOSIS — M1712 Unilateral primary osteoarthritis, left knee: Secondary | ICD-10-CM | POA: Diagnosis not present

## 2024-01-11 ENCOUNTER — Encounter: Admitting: Internal Medicine

## 2024-01-11 NOTE — Progress Notes (Deleted)
 Subjective:    Patient ID: Haley Morris, female    DOB: 1951-05-03, 73 y.o.   MRN: [id number]  HPI  Pt presents to the clinic today for her annual exam.  Flu: Never Tetanus: >10 years ago Covid: Moderna x 5 Pneumovax: Never Prevnar: Never Shingrix: Never Pap smear: Hysterectomy Mammogram: 09/2022 Bone density: 08/2019 Colon screening: 09/2019 Vision screening: annually Dentist: biannually  Diet: She does eat meat. She consumes fruits and veggies. She does eat fried foods. She drinks mostly water. Exercise: None  Review of Systems     Past Medical History:  Diagnosis Date   Arthritis    knees    Current Outpatient Medications  Medication Sig Dispense Refill   acetaminophen (TYLENOL) 500 MG tablet Take 500 mg by mouth every 6 (six) hours as needed.     Ascorbic Acid (VITAMIN C PO) Take by mouth daily.     B Complex Vitamins (VITAMIN B COMPLEX PO) Take by mouth daily.     Calcium Carb-Cholecalciferol (CALCIUM 1000 + D PO) Take by mouth.     CINNAMON PO Take by mouth daily.     ELDERBERRY PO Take by mouth.     Ginger, Zingiber officinalis, (GINGER ROOT) 550 MG CAPS Take by mouth.      ibuprofen (ADVIL) 400 MG tablet Take by mouth.     Multiple Vitamin (MULTIVITAMIN) tablet Take 1 tablet by mouth daily.     TURMERIC PO Take by mouth daily.     No current facility-administered medications for this visit.    Allergies  Allergen Reactions   Cefdinir Swelling    throat closes   Erythromycin     Cousin is allergic   Hydromet [Hydrocodone Bit-Homatrop Mbr] Nausea And Vomiting    Family History  Problem Relation Age of Onset   Heart attack Father    Lung cancer Brother     Social History   Socioeconomic History   Marital status: Widowed    Spouse name: Not on file   Number of children: 1   Years of education: Not on file   Highest education level: Not on file  Occupational History   Occupation: retired   Tobacco Use   Smoking status: Former    Smokeless tobacco: Never   Tobacco comments:    socially in her 55s  Vaping Use   Vaping status: Never Used  Substance and Sexual Activity   Alcohol use: Not Currently   Drug use: Not Currently   Sexual activity: Not Currently  Other Topics Concern   Not on file  Social History Narrative   Not on file   Social Drivers of Health   Financial Resource Strain: Low Risk  (05/11/2022)   Overall Financial Resource Strain (CARDIA)    Difficulty of Paying Living Expenses: Not hard at all  Food Insecurity: No Food Insecurity (05/11/2022)   Hunger Vital Sign    Worried About Running Out of Food in the Last Year: Never true    Ran Out of Food in the Last Year: Never true  Transportation Needs: No Transportation Needs (05/11/2022)   PRAPARE - Administrator, Civil Service (Medical): No    Lack of Transportation (Non-Medical): No  Physical Activity: Insufficiently Active (05/11/2022)   Exercise Vital Sign    Days of Exercise per Week: 2 days    Minutes of Exercise per Session: 10 min  Stress: No Stress Concern Present (05/11/2022)   [employer] of Occupational Health - Occupational Stress Questionnaire  Feeling of Stress : Not at all  Social Connections: Moderately Integrated (05/11/2022)   Social Connection and Isolation Panel    Frequency of Communication with Friends and Family: More than three times a week    Frequency of Social Gatherings with Friends and Family: More than three times a week    Attends Religious Services: More than 4 times per year    Active Member of [employer] or Organizations: Yes    Attends Banker Meetings: More than 4 times per year    Marital Status: Widowed  Intimate Partner Violence: Not At Risk (05/11/2022)   Humiliation, Afraid, Rape, and Kick questionnaire    Fear of Current or Ex-Partner: No    Emotionally Abused: No    Physically Abused: No    Sexually Abused: No     Constitutional: Denies fever, malaise, fatigue,  headache or abrupt weight changes.  HEENT: Denies eye pain, eye redness, ear pain, ringing in the ears, wax buildup, runny nose, nasal congestion, bloody nose, or sore throat. Respiratory: Denies difficulty breathing, shortness of breath, cough or sputum production.   Cardiovascular: Denies chest pain, chest tightness, palpitations or swelling in the hands or feet.  Gastrointestinal: Denies abdominal pain, bloating, constipation, diarrhea or blood in the stool.  GU: Denies urgency, frequency, pain with urination, burning sensation, blood in urine, odor or discharge. Musculoskeletal: Patient reports joint pain in back and knees.  Denies decrease in range of motion, difficulty with gait, muscle pain or joint swelling.  Skin: Denies redness, rashes, lesions or ulcercations.  Neurological: Denies dizziness, difficulty with memory, difficulty with speech or problems with balance and coordination.  Psych: Denies anxiety, depression, SI/HI.  No other specific complaints in a complete review of systems (except as listed in HPI above).  Objective:   Physical Exam  There were no vitals taken for this visit.  Wt Readings from Last 3 Encounters:  11/10/22 201 lb (91.2 kg)  09/10/22 203 lb 3.2 oz (92.2 kg)  05/11/22 211 lb (95.7 kg)    General: Appears her stated age, obese, in NAD. Skin: Warm, dry and intact.  HEENT: Head: normal shape and size; Eyes: sclera white, no icterus, conjunctiva pink, PERRLA, strabismus noted;  Neck:  Neck supple, trachea midline. No masses, lumps or thyromegaly present.  Cardiovascular: Tachycardic with normal rhythm. S1,S2 noted.  No murmur, rubs or gallops noted. No JVD or BLE edema. No carotid bruits noted. Pulmonary/Chest: Normal effort and positive vesicular breath sounds. No respiratory distress. No wheezes, rales or ronchi noted.  Abdomen: Soft and nontender. Normal bowel sounds.  Musculoskeletal: Strength 5/5 BUE. Limping gait.  Neurological: Alert and  oriented. Cranial nerves II-XII grossly intact. Coordination normal.  Psychiatric: Mood and affect normal. Behavior is normal. Judgment and thought content normal.     BMET    Component Value Date/Time   NA 140 11/10/2022 1512   K 4.1 11/10/2022 1512   CL 106 11/10/2022 1512   CO2 22 11/10/2022 1512   GLUCOSE 118 (H) 11/10/2022 1512   BUN 19 11/10/2022 1512   CREATININE 0.83 11/10/2022 1512   CALCIUM 9.7 11/10/2022 1512   GFRNONAA 82 01/18/2020 1033   GFRAA 95 01/18/2020 1033    Lipid Panel     Component Value Date/Time   CHOL 260 (H) 11/10/2022 1512   TRIG 127 11/10/2022 1512   HDL 56 11/10/2022 1512   CHOLHDL 4.6 11/10/2022 1512   LDLCALC 178 (H) 11/10/2022 1512    CBC    Component Value  Date/Time   WBC 7.5 11/10/2022 1512   RBC 4.34 11/10/2022 1512   HGB 13.4 11/10/2022 1512   HCT 38.9 11/10/2022 1512   PLT 301 11/10/2022 1512   MCV 89.6 11/10/2022 1512   MCH 30.9 11/10/2022 1512   MCHC 34.4 11/10/2022 1512   RDW 12.5 11/10/2022 1512   LYMPHSABS 2,240 01/18/2020 1033   EOSABS 152 01/18/2020 1033   BASOSABS 32 01/18/2020 1033    Hgb A1C Lab Results  Component Value Date   HGBA1C 5.9 (H) 11/10/2022           Assessment & Plan:   Preventative Health Maintenance:  She declines flu shot She declines tetanus shot Encouraged her to get her COVID booster She declines Pneumovax or Prevnar She declines Shingrix She no longer needs to screen for cervical cancer Mammogram already scheduled Bone density UTD Colon screening UTD Encouraged her to consume a balanced diet and exercise regimen Advised her to see an eye doctor and dentist annually We will check CBC, c-Met, lipid and A1c today  RTC in 6 months, follow-up chronic conditions Angeline Laura, NP

## 2024-01-13 ENCOUNTER — Encounter

## 2024-01-14 ENCOUNTER — Ambulatory Visit

## 2024-01-14 DIAGNOSIS — Z0000 Encounter for general adult medical examination without abnormal findings: Secondary | ICD-10-CM | POA: Diagnosis not present

## 2024-01-14 DIAGNOSIS — Z780 Asymptomatic menopausal state: Secondary | ICD-10-CM

## 2024-01-14 NOTE — Patient Instructions (Addendum)
 Haley Morris , Thank you for taking time out of your busy schedule to complete your Annual Wellness Visit with me. I enjoyed our conversation and look forward to speaking with you again next year. I, as well as your [facility] team,  appreciate your ongoing commitment to your health goals. Please review the following plan we discussed and let me know if I can assist you in the future.  REFERRAL FOR BONE DENSITY SENT  You have an order for:  []   2D Mammogram  []   3D Mammogram  [x]   Bone Density     Please call for appointment:  Haley Morris Haley [facility]  [address] [place] [address] [phone] [facility] [address] [place], [place] [address] [phone] Haley Morris [address] [place] Haley Morris [place], [place] [address] [phone]   Make sure to wear two-piece clothing.  No lotions, powders, or deodorants the day of the appointment. Make sure to bring picture ID and insurance card.  Bring list of medications you are currently taking including any supplements.   Schedule your  screening mammogram through MyChart!   Log into your MyChart account.  Go to 'Visit' (or 'Appointments' if on mobile App) --> Schedule an Appointment  Under 'Select a Reason for Visit' choose the Mammogram Screening option.  Complete the pre-visit questions and select the time and place that best fits your schedule.   Follow up Visits: 01/19/25 @ 9:30 AM BY PHONE We will see or speak with you next year for your Next Medicare AWV with our clinical staff Have you seen your provider in the last 6 months (3 months if uncontrolled diabetes)? Yes  Clinician Recommendations:  Aim for 30 minutes of exercise or brisk walking, 6-8 glasses of water, and 5 servings of fruits and vegetables each day. YOU'RE DOING GREAT, KEEP IT UP!      This is a list of the screenings recommended for you:  Health  Maintenance  Topic Date Due   DTaP/Tdap/Td vaccine (1 - Tdap) Never done   Pneumococcal Vaccine for age over 36 (1 of 1 - PCV) Never done   Zoster (Shingles) Vaccine (1 of 2) Never done   DEXA scan (bone density measurement)  09/12/2021   Mammogram  09/30/2023   Flu Shot  01/14/2024   Medicare Annual Wellness Visit  01/13/2025   Cologuard (Stool DNA test)  11/23/2025   Hepatitis C Screening  Completed   Hepatitis B Vaccine  Aged Out   HPV Vaccine  Aged Out   Meningitis B Vaccine  Aged Out   COVID-19 Vaccine  Discontinued    Advanced directives: (In Chart) A copy of your advanced directives are scanned into your chart should your provider ever need it. Advance [facility] Planning is important because it:  [x]  Makes sure you receive the medical [facility] that is consistent with your values, goals, and preferences  [x]  It provides guidance to your family and loved ones and reduces their decisional burden about whether or not they are making the right decisions based on your wishes.  Follow the link provided in your after visit summary or read over the paperwork we have mailed to you to help you started getting your Advance Directives in place. If you need assistance in completing these, please reach out to us  so that we can help you!

## 2024-01-14 NOTE — Progress Notes (Signed)
 Subjective:   RIYANSHI WAHAB is a 73 y.o. who presents for a Medicare Wellness preventive visit.  As a reminder, Annual Wellness Visits don't include a physical exam, and some assessments may be limited, especially if this visit is performed virtually. We may recommend an in-person follow-up visit with your provider if needed.  Visit Complete: Virtual I connected with  Glorine E Mule on 01/14/24 by a audio enabled telemedicine application and verified that I am speaking with the correct person using two identifiers.  Patient Location: Home  Provider Location: Home Office  I discussed the limitations of evaluation and management by telemedicine. The patient expressed understanding and agreed to proceed.  Vital Signs: Because this visit was a virtual/telehealth visit, some criteria may be missing or patient reported. Any vitals not documented were not able to be obtained and vitals that have been documented are patient reported.  VideoDeclined- This patient declined Librarian, academic. Therefore the visit was completed with audio only.  Persons Participating in Visit: Patient.  AWV Questionnaire: No: Patient Medicare AWV questionnaire was not completed prior to this visit.  Cardiac Risk Factors include: advanced age (>63men, >66 women);dyslipidemia;hypertension;obesity (BMI >30kg/m2)     Objective:    Today's Vitals   01/14/24 0856  PainSc: 4    There is no height or weight on file to calculate BMI.     01/14/2024    9:01 AM 05/11/2022    4:01 PM 09/26/2019    9:52 AM 12/05/2018    8:20 AM 11/08/2018    8:35 AM  Advanced Directives  Does Patient Have a Medical Advance Directive? Yes No No No Yes  Type of Estate agent of [place];Living will    Healthcare Power of [place];Living will  Does patient want to make changes to medical advance directive? No - Patient declined    No - Guardian declined   Copy of Healthcare Power of  Attorney in Chart? Yes - validated most recent copy scanned in chart (See row information)    Yes - validated most recent copy scanned in chart (See row information)   Would patient like information on creating a medical advance directive?  No - Patient declined  No - Patient declined  No - Patient declined      Data saved with a previous flowsheet row definition    Current Medications (verified) Outpatient Encounter Medications as of 01/14/2024  Medication Sig   acetaminophen (TYLENOL) 500 MG tablet Take 500 mg by mouth every 6 (six) hours as needed.   Ascorbic Acid (VITAMIN C PO) Take by mouth daily.   B Complex Vitamins (VITAMIN B COMPLEX PO) Take by mouth daily.   Calcium Carb-Cholecalciferol (CALCIUM 1000 + D PO) Take by mouth.   CINNAMON PO Take by mouth daily.   ELDERBERRY PO Take by mouth.   Ginger, Zingiber officinalis, (GINGER ROOT) 550 MG CAPS Take by mouth.    ibuprofen (ADVIL) 400 MG tablet Take by mouth.   Multiple Vitamin (MULTIVITAMIN) tablet Take 1 tablet by mouth daily.   TURMERIC PO Take by mouth daily.   No facility-administered encounter medications on file as of 01/14/2024.    Allergies (verified) Cefdinir, Erythromycin, and Hydromet [hydrocodone bit-homatrop mbr]   History: Past Medical History:  Diagnosis Date   Arthritis    knees   Past Surgical History:  Procedure Laterality Date   ABDOMINAL HYSTERECTOMY     BREAST BIOPSY Right 10/05/2019   us  bx, path pending, heart marker at  2:30   CATARACT EXTRACTION W/PHACO Left 11/08/2018   Procedure: CATARACT EXTRACTION PHACO AND INTRAOCULAR LENS PLACEMENT (IOC) LEFT;  Surgeon: Myrna Adine Anes, MD;  Location: [facility];  Service: Ophthalmology;  Laterality: Left;   CATARACT EXTRACTION W/PHACO Right 12/05/2018   Procedure: CATARACT EXTRACTION PHACO AND INTRAOCULAR LENS PLACEMENT (IOC)  RIGHT;  Surgeon: Myrna Adine Anes, MD;  Location: [facility];  Service: Ophthalmology;  Laterality: Right;    CHOLECYSTECTOMY     COLONOSCOPY     LESION EXCISION     nose   MASS EXCISION Left    thumb   PARTIAL HYSTERECTOMY     Family History  Problem Relation Age of Onset   Heart attack Father    Lung cancer Brother    Social History   Socioeconomic History   Marital status: Widowed    Spouse name: Not on file   Number of children: 1   Years of education: Not on file   Highest education level: Not on file  Occupational History   Occupation: retired   Tobacco Use   Smoking status: Former   Smokeless tobacco: Never   Tobacco comments:    socially in her 44s  Vaping Use   Vaping status: Never Used  Substance and Sexual Activity   Alcohol use: Not Currently   Drug use: Not Currently   Sexual activity: Not Currently  Other Topics Concern   Not on file  Social History Narrative   Not on file   Social Drivers of Health   Financial Resource Strain: Low Risk  (01/14/2024)   Overall Financial Resource Strain (CARDIA)    Difficulty of Paying Living Expenses: Not hard at all  Food Insecurity: No Food Insecurity (01/14/2024)   Hunger Vital Sign    Worried About Running Out of Food in the Last Year: Never true    Ran Out of Food in the Last Year: Never true  Transportation Needs: No Transportation Needs (01/14/2024)   PRAPARE - Administrator, Civil Service (Medical): No    Lack of Transportation (Non-Medical): No  Physical Activity: Sufficiently Active (01/14/2024)   Exercise Vital Sign    Days of Exercise per Week: 7 days    Minutes of Exercise per Session: 60 min  Stress: No Stress Concern Present (01/14/2024)   [employer] of Occupational Health - Occupational Stress Questionnaire    Feeling of Stress: Not at all  Social Connections: Moderately Isolated (01/14/2024)   Social Connection and Isolation Panel    Frequency of Communication with Friends and Family: More than three times a week    Frequency of Social Gatherings with Friends and Family: More than three times  a week    Attends Religious Services: More than 4 times per year    Active Member of [employer] or Organizations: No    Attends Banker Meetings: Never    Marital Status: Widowed    Tobacco Counseling Counseling given: Not Answered Tobacco comments: socially in her 43s    Clinical Intake:  Pre-visit preparation completed: Yes  Pain : 0-10 Pain Score: 4  Pain Type: Chronic pain Pain Location: Knee Pain Orientation: Left Pain Descriptors / Indicators: Aching, Discomfort, Constant Pain Onset: More than a month ago Pain Frequency: Constant Pain Relieving Factors: aleve- going to have surgery on it soon  Pain Relieving Factors: aleve- going to have surgery on it soon  BMI - recorded: 36.76 Nutritional Status: BMI > 30  Obese Nutritional Risks: None Diabetes: No  Lab Results  Component Value Date   HGBA1C 5.9 (H) 11/10/2022   HGBA1C 6.3 (H) 05/11/2022   HGBA1C 6.4 (H) 10/28/2021     How often do you need to have someone help you when you read instructions, pamphlets, or other written materials from your doctor or pharmacy?: 1 - Never  Interpreter Needed?: No  Information entered by :: JHONNIE DAS, LPN   Activities of Daily Living    01/14/2024    9:03 AM  In your present state of health, do you have any difficulty performing the following activities:  Hearing? 0  Vision? 0  Difficulty concentrating or making decisions? 0  Walking or climbing stairs? 1  Comment KNEE PAIN  Dressing or bathing? 0  Doing errands, shopping? 0  Preparing Food and eating ? N  Using the Toilet? N  In the past six months, have you accidently leaked urine? N  Do you have problems with loss of bowel control? N  Managing your Medications? N  Managing your Finances? N  Housekeeping or managing your Housekeeping? N    Patient Care Team: Antonette Angeline ORN, NP as PCP - General (Internal Medicine) Myrna Adine Anes, MD as Consulting Physician (Ophthalmology)  I have updated  your Care Teams any recent Medical Services you may have received from other providers in the past year.     Assessment:   This is a routine wellness examination for Jennalyn.  Hearing/Vision screen Hearing Screening - Comments:: NO AIDS Vision Screening - Comments:: READERS, HAD CATARACT SGY- [facility]- HAS APPT NEXT MONTH    Goals Addressed             This Visit's Progress    DIET - EAT MORE FRUITS AND VEGETABLES         Depression Screen     01/14/2024    8:59 AM 11/10/2022    3:11 PM 09/10/2022    2:55 PM 05/11/2022    3:27 PM 10/28/2021    8:16 AM 04/28/2021    9:40 AM 10/24/2020    8:13 AM  PHQ 2/9 Scores  PHQ - 2 Score 0 2 2 0 2 0 0  PHQ- 9 Score 0 3 4  2  0 0    Fall Risk     01/14/2024    9:02 AM 11/10/2022    3:11 PM 09/10/2022    2:56 PM 05/11/2022    3:42 PM 05/11/2022    3:28 PM  Fall Risk   Falls in the past year? 1 0 1 1 1   Number falls in past yr: 0  1 1 0  Injury with Fall? 0 0 0 1 1  Risk for fall due to :  Orthopedic patient Impaired mobility Impaired balance/gait   Follow up Falls evaluation completed;Falls prevention discussed   Falls evaluation completed       Data saved with a previous flowsheet row definition    MEDICARE RISK AT HOME:  Medicare Risk at Home Any stairs in or around the home?: No If so, are there any without handrails?: No Home free of loose throw rugs in walkways, pet beds, electrical cords, etc?: Yes Adequate lighting in your home to reduce risk of falls?: Yes Life alert?: No Use of a cane, walker or w/c?: Yes (QUAD CANE SOMETIMES) Grab bars in the bathroom?: No Shower chair or bench in shower?: Yes Elevated toilet seat or a handicapped toilet?: Yes  TIMED UP AND GO:  Was the test performed?  No  Cognitive Function:  6CIT completed        01/14/2024    9:05 AM 05/11/2022    3:43 PM  6CIT Screen  What Year? 0 points 0 points  What month? 0 points 0 points  What time? 0 points 0 points  Count back from 20 0  points 0 points  Months in reverse 0 points 0 points  Repeat phrase 0 points 0 points  Total Score 0 points 0 points    Immunizations Immunization History  Administered Date(s) Administered   Moderna Covid-19 Fall Seasonal Vaccine 33yrs & older 04/26/2023   Moderna Covid-19 Vaccine Bivalent Booster 85yrs & up 01/27/2021   Moderna SARS-COV2 Booster Vaccination 04/02/2021   Moderna Sars-Covid-2 Vaccination 10/31/2019, 11/28/2019    Screening Tests Health Maintenance  Topic Date Due   DTaP/Tdap/Td (1 - Tdap) Never done   Pneumococcal Vaccine: 50+ Years (1 of 1 - PCV) Never done   Zoster Vaccines- Shingrix (1 of 2) Never done   DEXA SCAN  09/12/2021   MAMMOGRAM  09/30/2023   INFLUENZA VACCINE  01/14/2024   Medicare Annual Wellness (AWV)  01/13/2025   Fecal DNA (Cologuard)  11/23/2025   Hepatitis C Screening  Completed   Hepatitis B Vaccines  Aged Out   HPV VACCINES  Aged Out   Meningococcal B Vaccine  Aged Out   COVID-19 Vaccine  Discontinued    Health Maintenance  Health Maintenance Due  Topic Date Due   DTaP/Tdap/Td (1 - Tdap) Never done   Pneumococcal Vaccine: 50+ Years (1 of 1 - PCV) Never done   Zoster Vaccines- Shingrix (1 of 2) Never done   DEXA SCAN  09/12/2021   MAMMOGRAM  09/30/2023   INFLUENZA VACCINE  01/14/2024   Health Maintenance Items Addressed: HAS MAMMOGRAM SCHEDULED; UP TO DATE ON COLOGUARD;BDS ORDERED; UP TO DATE ON COVID, DECLINES OTHER SHOTS  Additional Screening:  Vision Screening: Recommended annual ophthalmology exams for early detection of glaucoma and other disorders of the eye. Would you like a referral to an eye doctor? No    Dental Screening: Recommended annual dental exams for proper oral hygiene  Community Resource Referral / Chronic Care Management: CRR required this visit?  No   CCM required this visit?  No   Plan:    I have personally reviewed and noted the following in the patient's chart:   Medical and social  history Use of alcohol, tobacco or illicit drugs  Current medications and supplements including opioid prescriptions. Patient is not currently taking opioid prescriptions. Functional ability and status Nutritional status Physical activity Advanced directives List of other physicians Hospitalizations, surgeries, and ER visits in previous 12 months Vitals Screenings to include cognitive, depression, and falls Referrals and appointments  In addition, I have reviewed and discussed with patient certain preventive protocols, quality metrics, and best practice recommendations. A written personalized care plan for preventive services as well as general preventive health recommendations were provided to patient.   Jhonnie GORMAN Das, LPN   06/22/7972   After Visit Summary: (MyChart) Due to this being a telephonic visit, the after visit summary with patients personalized plan was offered to patient via MyChart   Notes: BDS ORDERED

## 2024-01-18 ENCOUNTER — Encounter: Admitting: Internal Medicine

## 2024-01-18 NOTE — Progress Notes (Deleted)
 Subjective:    Patient ID: Haley [place], female    DOB: [birth year], 73 y.o.   MRN: [id number]  HPI  Pt presents to the clinic today for her annual exam.  Flu: Never Tetanus: >10 years ago Covid: Moderna x5 Pneumovax: Never Prevnar: Never Shingrix: Never Pap smear: Hysterectomy Mammogram: 01/2024 Bone density: 08/2019 Colon screening: 09/2022, Cologuard Vision screening: annually Dentist: biannually  Diet: She does eat meat. She consumes fruits and veggies. She does eat fried foods. She drinks mostly water. Exercise: None  Review of Systems     Past Medical History:  Diagnosis Date   Arthritis    knees    Current Outpatient Medications  Medication Sig Dispense Refill   acetaminophen (TYLENOL) 500 MG tablet Take 500 mg by mouth every 6 (six) hours as needed.     Ascorbic Acid (VITAMIN C PO) Take by mouth daily.     B Complex Vitamins (VITAMIN B COMPLEX PO) Take by mouth daily.     Calcium Carb-Cholecalciferol (CALCIUM 1000 + D PO) Take by mouth.     CINNAMON PO Take by mouth daily.     ELDERBERRY PO Take by mouth.     Ginger, Zingiber officinalis, (GINGER ROOT) 550 MG CAPS Take by mouth.      ibuprofen (ADVIL) 400 MG tablet Take by mouth.     Multiple Vitamin (MULTIVITAMIN) tablet Take 1 tablet by mouth daily.     TURMERIC PO Take by mouth daily.     No current facility-administered medications for this visit.    Allergies  Allergen Reactions   Cefdinir Swelling    throat closes   Erythromycin     Cousin is allergic   Hydromet [Hydrocodone Bit-Homatrop Mbr] Nausea And Vomiting    Family History  Problem Relation Age of Onset   Heart attack Father    Lung cancer Brother     Social History   Socioeconomic History   Marital status: Widowed    Spouse name: Not on file   Number of children: 1   Years of education: Not on file   Highest education level: Not on file  Occupational History   Occupation: retired   Tobacco Use   Smoking status: Former    Smokeless tobacco: Never   Tobacco comments:    socially in her 13s  Vaping Use   Vaping status: Never Used  Substance and Sexual Activity   Alcohol use: Not Currently   Drug use: Not Currently   Sexual activity: Not Currently  Other Topics Concern   Not on file  Social History Narrative   Not on file   Social Drivers of Health   Financial Resource Strain: Low Risk  (01/14/2024)   Overall Financial Resource Strain (CARDIA)    Difficulty of Paying Living Expenses: Not hard at all  Food Insecurity: No Food Insecurity (01/14/2024)   Hunger Vital Sign    Worried About Running Out of Food in the Last Year: Never true    Ran Out of Food in the Last Year: Never true  Transportation Needs: No Transportation Needs (01/14/2024)   PRAPARE - Administrator, Civil Service (Medical): No    Lack of Transportation (Non-Medical): No  Physical Activity: Sufficiently Active (01/14/2024)   Exercise Vital Sign    Days of Exercise per Week: 7 days    Minutes of Exercise per Session: 60 min  Stress: No Stress Concern Present (01/14/2024)   [employer] of Occupational Health - Occupational Stress Questionnaire  Feeling of Stress: Not at all  Social Connections: Moderately Isolated (01/14/2024)   Social Connection and Isolation Panel    Frequency of Communication with Friends and Family: More than three times a week    Frequency of Social Gatherings with Friends and Family: More than three times a week    Attends Religious Services: More than 4 times per year    Active Member of [employer] or Organizations: No    Attends Banker Meetings: Never    Marital Status: Widowed  Intimate Partner Violence: Not At Risk (01/14/2024)   Humiliation, Afraid, Rape, and Kick questionnaire    Fear of Current or Ex-Partner: No    Emotionally Abused: No    Physically Abused: No    Sexually Abused: No     Constitutional: Denies fever, malaise, fatigue, headache or abrupt weight changes.   HEENT: Denies eye pain, eye redness, ear pain, ringing in the ears, wax buildup, runny nose, nasal congestion, bloody nose, or sore throat. Respiratory: Denies difficulty breathing, shortness of breath, cough or sputum production.   Cardiovascular: Denies chest pain, chest tightness, palpitations or swelling in the hands or feet.  Gastrointestinal: Denies abdominal pain, bloating, constipation, diarrhea or blood in the stool.  GU: Denies urgency, frequency, pain with urination, burning sensation, blood in urine, odor or discharge. Musculoskeletal: Patient reports joint pain in back and knees.  Denies decrease in range of motion, difficulty with gait, muscle pain or joint swelling.  Skin: Denies redness, rashes, lesions or ulcercations.  Neurological: Denies dizziness, difficulty with memory, difficulty with speech or problems with balance and coordination.  Psych: Denies anxiety, depression, SI/HI.  No other specific complaints in a complete review of systems (except as listed in HPI above).  Objective:   Physical Exam  There were no vitals taken for this visit.  Wt Readings from Last 3 Encounters:  11/10/22 201 lb (91.2 kg)  09/10/22 203 lb 3.2 oz (92.2 kg)  05/11/22 211 lb (95.7 kg)    General: Appears her stated age, obese, in NAD. Skin: Warm, dry and intact.  HEENT: Head: normal shape and size; Eyes: sclera white, no icterus, conjunctiva pink, PERRLA, strabismus noted;  Neck:  Neck supple, trachea midline. No masses, lumps or thyromegaly present.  Cardiovascular: Tachycardic with normal rhythm. S1,S2 noted.  No murmur, rubs or gallops noted. No JVD or BLE edema. No carotid bruits noted. Pulmonary/Chest: Normal effort and positive vesicular breath sounds. No respiratory distress. No wheezes, rales or ronchi noted.  Abdomen: Soft and nontender. Normal bowel sounds.  Musculoskeletal: Strength 5/5 BUE. Limping gait.  Neurological: Alert and oriented. Cranial nerves II-XII grossly  intact. Coordination normal.  Psychiatric: Mood and affect normal. Behavior is normal. Judgment and thought content normal.     BMET    Component Value Date/Time   NA 140 11/10/2022 1512   K 4.1 11/10/2022 1512   CL 106 11/10/2022 1512   CO2 22 11/10/2022 1512   GLUCOSE 118 (H) 11/10/2022 1512   BUN 19 11/10/2022 1512   CREATININE 0.83 11/10/2022 1512   CALCIUM 9.7 11/10/2022 1512   GFRNONAA 82 01/18/2020 1033   GFRAA 95 01/18/2020 1033    Lipid Panel     Component Value Date/Time   CHOL 260 (H) 11/10/2022 1512   TRIG 127 11/10/2022 1512   HDL 56 11/10/2022 1512   CHOLHDL 4.6 11/10/2022 1512   LDLCALC 178 (H) 11/10/2022 1512    CBC    Component Value Date/Time   WBC 7.5 11/10/2022  1512   RBC 4.34 11/10/2022 1512   HGB 13.4 11/10/2022 1512   HCT 38.9 11/10/2022 1512   PLT 301 11/10/2022 1512   MCV 89.6 11/10/2022 1512   MCH 30.9 11/10/2022 1512   MCHC 34.4 11/10/2022 1512   RDW 12.5 11/10/2022 1512   LYMPHSABS 2,240 01/18/2020 1033   EOSABS 152 01/18/2020 1033   BASOSABS 32 01/18/2020 1033    Hgb A1C Lab Results  Component Value Date   HGBA1C 5.9 (H) 11/10/2022           Assessment & Plan:   Preventative Health Maintenance:  She declines flu shot She declines tetanus shot Encouraged her to get her COVID booster She declines Pneumovax or Prevnar She declines Shingrix She no longer needs to screen for cervical cancer Mammogram UTD Bone density ordered-she will call to schedule Colon screening UTD Encouraged her to consume a balanced diet and exercise regimen Advised her to see [facility] doctor and dentist annually We will check CBC, c-Met, lipid and A1c today  RTC in 6 months, follow-up chronic conditions Angeline Laura, NP

## 2024-01-19 ENCOUNTER — Ambulatory Visit: Payer: Self-pay

## 2024-01-19 ENCOUNTER — Encounter: Admitting: Internal Medicine

## 2024-01-19 NOTE — Progress Notes (Deleted)
 Subjective:    Patient ID: Haley [place], female    DOB: [birth year], 73 y.o.   MRN: [id number]  HPI  Pt presents to the clinic today for her annual exam.  Flu: Never Tetanus: >10 years ago Covid: Moderna x5 Pneumovax: Never Prevnar: Never Shingrix: Never Pap smear: Hysterectomy Mammogram: 01/2024 Bone density: 08/2019 Colon screening: 09/2022, Cologuard Vision screening: annually Dentist: biannually  Diet: She does eat meat. She consumes fruits and veggies. She does eat fried foods. She drinks mostly water. Exercise: None  Review of Systems     Past Medical History:  Diagnosis Date   Arthritis    knees    Current Outpatient Medications  Medication Sig Dispense Refill   acetaminophen (TYLENOL) 500 MG tablet Take 500 mg by mouth every 6 (six) hours as needed.     Ascorbic Acid (VITAMIN C PO) Take by mouth daily.     B Complex Vitamins (VITAMIN B COMPLEX PO) Take by mouth daily.     Calcium Carb-Cholecalciferol (CALCIUM 1000 + D PO) Take by mouth.     CINNAMON PO Take by mouth daily.     ELDERBERRY PO Take by mouth.     Ginger, Zingiber officinalis, (GINGER ROOT) 550 MG CAPS Take by mouth.      ibuprofen (ADVIL) 400 MG tablet Take by mouth.     Multiple Vitamin (MULTIVITAMIN) tablet Take 1 tablet by mouth daily.     TURMERIC PO Take by mouth daily.     No current facility-administered medications for this visit.    Allergies  Allergen Reactions   Cefdinir Swelling    throat closes   Erythromycin     Cousin is allergic   Hydromet [Hydrocodone Bit-Homatrop Mbr] Nausea And Vomiting    Family History  Problem Relation Age of Onset   Heart attack Father    Lung cancer Brother     Social History   Socioeconomic History   Marital status: Widowed    Spouse name: Not on file   Number of children: 1   Years of education: Not on file   Highest education level: Not on file  Occupational History   Occupation: retired   Tobacco Use   Smoking status: Former    Smokeless tobacco: Never   Tobacco comments:    socially in her 13s  Vaping Use   Vaping status: Never Used  Substance and Sexual Activity   Alcohol use: Not Currently   Drug use: Not Currently   Sexual activity: Not Currently  Other Topics Concern   Not on file  Social History Narrative   Not on file   Social Drivers of Health   Financial Resource Strain: Low Risk  (01/14/2024)   Overall Financial Resource Strain (CARDIA)    Difficulty of Paying Living Expenses: Not hard at all  Food Insecurity: No Food Insecurity (01/14/2024)   Hunger Vital Sign    Worried About Running Out of Food in the Last Year: Never true    Ran Out of Food in the Last Year: Never true  Transportation Needs: No Transportation Needs (01/14/2024)   PRAPARE - Administrator, Civil Service (Medical): No    Lack of Transportation (Non-Medical): No  Physical Activity: Sufficiently Active (01/14/2024)   Exercise Vital Sign    Days of Exercise per Week: 7 days    Minutes of Exercise per Session: 60 min  Stress: No Stress Concern Present (01/14/2024)   [employer] of Occupational Health - Occupational Stress Questionnaire  Feeling of Stress: Not at all  Social Connections: Moderately Isolated (01/14/2024)   Social Connection and Isolation Panel    Frequency of Communication with Friends and Family: More than three times a week    Frequency of Social Gatherings with Friends and Family: More than three times a week    Attends Religious Services: More than 4 times per year    Active Member of [employer] or Organizations: No    Attends Banker Meetings: Never    Marital Status: Widowed  Intimate Partner Violence: Not At Risk (01/14/2024)   Humiliation, Afraid, Rape, and Kick questionnaire    Fear of Current or Ex-Partner: No    Emotionally Abused: No    Physically Abused: No    Sexually Abused: No     Constitutional: Denies fever, malaise, fatigue, headache or abrupt weight changes.   HEENT: Denies eye pain, eye redness, ear pain, ringing in the ears, wax buildup, runny nose, nasal congestion, bloody nose, or sore throat. Respiratory: Denies difficulty breathing, shortness of breath, cough or sputum production.   Cardiovascular: Denies chest pain, chest tightness, palpitations or swelling in the hands or feet.  Gastrointestinal: Denies abdominal pain, bloating, constipation, diarrhea or blood in the stool.  GU: Denies urgency, frequency, pain with urination, burning sensation, blood in urine, odor or discharge. Musculoskeletal: Patient reports joint pain in back and knees.  Denies decrease in range of motion, difficulty with gait, muscle pain or joint swelling.  Skin: Denies redness, rashes, lesions or ulcercations.  Neurological: Denies dizziness, difficulty with memory, difficulty with speech or problems with balance and coordination.  Psych: Denies anxiety, depression, SI/HI.  No other specific complaints in a complete review of systems (except as listed in HPI above).  Objective:   Physical Exam  There were no vitals taken for this visit.  Wt Readings from Last 3 Encounters:  11/10/22 201 lb (91.2 kg)  09/10/22 203 lb 3.2 oz (92.2 kg)  05/11/22 211 lb (95.7 kg)    General: Appears her stated age, obese, in NAD. Skin: Warm, dry and intact.  HEENT: Head: normal shape and size; Eyes: sclera white, no icterus, conjunctiva pink, PERRLA, strabismus noted;  Neck:  Neck supple, trachea midline. No masses, lumps or thyromegaly present.  Cardiovascular: Tachycardic with normal rhythm. S1,S2 noted.  No murmur, rubs or gallops noted. No JVD or BLE edema. No carotid bruits noted. Pulmonary/Chest: Normal effort and positive vesicular breath sounds. No respiratory distress. No wheezes, rales or ronchi noted.  Abdomen: Soft and nontender. Normal bowel sounds.  Musculoskeletal: Strength 5/5 BUE. Limping gait.  Neurological: Alert and oriented. Cranial nerves II-XII grossly  intact. Coordination normal.  Psychiatric: Mood and affect normal. Behavior is normal. Judgment and thought content normal.     BMET    Component Value Date/Time   NA 140 11/10/2022 1512   K 4.1 11/10/2022 1512   CL 106 11/10/2022 1512   CO2 22 11/10/2022 1512   GLUCOSE 118 (H) 11/10/2022 1512   BUN 19 11/10/2022 1512   CREATININE 0.83 11/10/2022 1512   CALCIUM 9.7 11/10/2022 1512   GFRNONAA 82 01/18/2020 1033   GFRAA 95 01/18/2020 1033    Lipid Panel     Component Value Date/Time   CHOL 260 (H) 11/10/2022 1512   TRIG 127 11/10/2022 1512   HDL 56 11/10/2022 1512   CHOLHDL 4.6 11/10/2022 1512   LDLCALC 178 (H) 11/10/2022 1512    CBC    Component Value Date/Time   WBC 7.5 11/10/2022  1512   RBC 4.34 11/10/2022 1512   HGB 13.4 11/10/2022 1512   HCT 38.9 11/10/2022 1512   PLT 301 11/10/2022 1512   MCV 89.6 11/10/2022 1512   MCH 30.9 11/10/2022 1512   MCHC 34.4 11/10/2022 1512   RDW 12.5 11/10/2022 1512   LYMPHSABS 2,240 01/18/2020 1033   EOSABS 152 01/18/2020 1033   BASOSABS 32 01/18/2020 1033    Hgb A1C Lab Results  Component Value Date   HGBA1C 5.9 (H) 11/10/2022           Assessment & Plan:   Preventative Health Maintenance:  She declines flu shot She declines tetanus shot Encouraged her to get her COVID booster She declines Pneumovax or Prevnar She declines Shingrix She no longer needs to screen for cervical cancer Mammogram UTD Bone density ordered-she will call to schedule Colon screening UTD Encouraged her to consume a balanced diet and exercise regimen Advised her to see [facility] doctor and dentist annually We will check CBC, c-Met, lipid and A1c today  RTC in 6 months, follow-up chronic conditions Angeline Laura, NP

## 2024-01-31 ENCOUNTER — Encounter

## 2024-02-11 DIAGNOSIS — H26493 Other secondary cataract, bilateral: Secondary | ICD-10-CM | POA: Diagnosis not present

## 2024-02-11 DIAGNOSIS — H5000 Unspecified esotropia: Secondary | ICD-10-CM | POA: Diagnosis not present

## 2024-02-11 DIAGNOSIS — H43813 Vitreous degeneration, bilateral: Secondary | ICD-10-CM | POA: Diagnosis not present

## 2024-02-11 DIAGNOSIS — Z961 Presence of intraocular lens: Secondary | ICD-10-CM | POA: Diagnosis not present

## 2024-02-15 ENCOUNTER — Telehealth: Payer: Self-pay

## 2024-02-15 NOTE — Telephone Encounter (Signed)
 Noted.  I have not seen this patient in over a year.  I do see that she has an upcoming appointment scheduled

## 2024-02-15 NOTE — Telephone Encounter (Signed)
 Copied from CRM [id number]. Topic: General - Other >> Feb 15, 2024  9:33 AM Revonda D wrote: Reason for CRM: Carly,NP with [facility] stated that she recently saw the pt and seen that she has a heart murmur. Carly,NP, stated that the pt has no symptoms and just wanted to inform [facility], of this concern.

## 2024-02-18 ENCOUNTER — Ambulatory Visit: Payer: Self-pay

## 2024-02-18 ENCOUNTER — Encounter: Payer: Self-pay | Admitting: Family Medicine

## 2024-02-18 ENCOUNTER — Ambulatory Visit: Admitting: Family Medicine

## 2024-02-18 VITALS — BP 130/84 | HR 98 | Temp 98.3°F | Ht 62.0 in | Wt 208.1 lb

## 2024-02-18 DIAGNOSIS — U071 COVID-19: Secondary | ICD-10-CM | POA: Diagnosis not present

## 2024-02-18 DIAGNOSIS — J029 Acute pharyngitis, unspecified: Secondary | ICD-10-CM | POA: Diagnosis not present

## 2024-02-18 LAB — POC COVID19 BINAXNOW: SARS Coronavirus 2 Ag: POSITIVE — AB

## 2024-02-18 LAB — POCT RAPID STREP A (OFFICE): Rapid Strep A Screen: NEGATIVE

## 2024-02-18 MED ORDER — BENZONATATE 100 MG PO CAPS: 100.0000 mg | ORAL_CAPSULE | Freq: Three times a day (TID) | ORAL | 0 refills | Status: DC | PRN

## 2024-02-18 MED ORDER — IPRATROPIUM BROMIDE 0.06 % NA SOLN: 2.0000 | Freq: Four times a day (QID) | NASAL | 0 refills | Status: AC

## 2024-02-18 MED ORDER — NIRMATRELVIR/RITONAVIR (PAXLOVID)TABLET: 3.0000 | ORAL_TABLET | Freq: Two times a day (BID) | ORAL | 0 refills | Status: AC

## 2024-02-18 NOTE — Progress Notes (Signed)
 Subjective:    Patient ID: Haley Morris, female    DOB: 1951-05-26, 73 y.o.   MRN: [id number]  Haley Morris is a 73 y.o. female presenting on 02/18/2024 for Sore Throat (Started last night )  Patient presents for a same day appointment.  PCP Angeline Laura, FNP   HPI  Discussed the use of AI scribe software for clinical note transcription with the patient, who gave verbal consent to proceed.  History of Present Illness   Haley Morris is a 73 year old female who presents with symptoms of COVID-19.  COVID-19 Upper respiratory symptoms - Sore throat, cough, and congestion present since before last night - Cough described as severe but does not disturb sleep  Fever and constitutional symptoms - Fever present since before last night - Severe night sweats with bed soaked in sweat the previous night  Covid-19 infection - Recent home COVID-19 test positive - History of COVID-19 infection at initial emergence, previously bedridden for approximately three months  Gastrointestinal symptoms - No nausea  Symptom management - Took two 650 mg doses of Tylenol last night for symptom relief - Able to tolerate ibuprofen          01/14/2024    8:59 AM 11/10/2022    3:11 PM 09/10/2022    2:55 PM  Depression screen PHQ 2/9  Decreased Interest 0 1 1  Down, Depressed, Hopeless 0 1 1  PHQ - 2 Score 0 2 2  Altered sleeping 0 0 0  Tired, decreased energy 0 1 1  Change in appetite 0 0 0  Feeling bad or failure about yourself  0 0 0  Trouble concentrating 0 0 0  Moving slowly or fidgety/restless 0 0 1  Suicidal thoughts 0 0 0  PHQ-9 Score 0 3 4  Difficult doing work/chores Not difficult at all Not difficult at all Very difficult       11/10/2022    3:11 PM 09/10/2022    2:56 PM 05/11/2022    3:28 PM 10/28/2021    8:16 AM  GAD 7 : Generalized Anxiety Score  Nervous, Anxious, on Edge 0 0 0 0  Control/stop worrying 0 0 0 0  Worry too much - different things 0 2 0 1  Trouble  relaxing 0 2 0 0  Restless 0 0 0 0  Easily annoyed or irritable 0 0 0 0  Afraid - awful might happen 1 0 0 0  Total GAD 7 Score 1 4 0 1  Anxiety Difficulty  Not difficult at all Not difficult at all     Social History   Tobacco Use   Smoking status: Former   Smokeless tobacco: Never   Tobacco comments:    socially in her 70s  Vaping Use   Vaping status: Never Used  Substance Use Topics   Alcohol use: Not Currently   Drug use: Not Currently    Review of Systems Per HPI unless specifically indicated above     Objective:    BP 130/84 (BP Location: Right Arm, Patient Position: Sitting, Cuff Size: Normal)   Pulse 98   Temp 98.3 F (36.8 C) (Oral)   Ht 5' 2 (1.575 m)   Wt 208 lb 2 oz (94.4 kg)   SpO2 93%   BMI 38.07 kg/m   Wt Readings from Last 3 Encounters:  02/18/24 208 lb 2 oz (94.4 kg)  11/10/22 201 lb (91.2 kg)  09/10/22 203 lb 3.2 oz (92.2 kg)  Physical Exam Vitals and nursing note reviewed.  Constitutional:      General: She is not in acute distress.    Appearance: Normal appearance. She is well-developed. She is not diaphoretic.     Comments: Well-appearing, comfortable, cooperative  HENT:     Head: Normocephalic and atraumatic.  Eyes:     General:        Right eye: No discharge.        Left eye: No discharge.     Conjunctiva/sclera: Conjunctivae normal.  Neck:     Thyroid : No thyromegaly.  Cardiovascular:     Rate and Rhythm: Normal rate and regular rhythm.     Heart sounds: Normal heart sounds. No murmur heard. Pulmonary:     Effort: Pulmonary effort is normal. No respiratory distress.     Breath sounds: Normal breath sounds. No wheezing or rales.  Musculoskeletal:        General: Normal range of motion.     Cervical back: Normal range of motion and neck supple.  Lymphadenopathy:     Cervical: No cervical adenopathy.  Skin:    General: Skin is warm and dry.     Findings: No erythema or rash.  Neurological:     Mental Status: She is alert  and oriented to person, place, and time.  Psychiatric:        Mood and Affect: Mood normal.        Behavior: Behavior normal.        Thought Content: Thought content normal.     Comments: Well groomed, good eye contact, normal speech and thoughts     Results for orders placed or performed in visit on 02/18/24  POCT rapid strep A   Collection Time: 02/18/24  3:00 PM  Result Value Ref Range   Rapid Strep A Screen Negative Negative  POC COVID-19 BinaxNow   Collection Time: 02/18/24  3:00 PM  Result Value Ref Range   SARS Coronavirus 2 Ag Positive (A) Negative      Assessment & Plan:   Problem List Items Addressed This Visit   None Visit Diagnoses       COVID-19    -  Primary   Relevant Medications   nirmatrelvir /ritonavir  (PAXLOVID ) 20 x 150 MG & 10 x 100MG  TABS   ipratropium (ATROVENT ) 0.06 % nasal spray   benzonatate  (TESSALON ) 100 MG capsule     Acute pharyngitis, unspecified etiology       Relevant Orders   POCT rapid strep A (Completed)   POC COVID-19 BinaxNow (Completed)         Acute COVID-19 infection Confirmed COVID-19 with mild symptoms. Age 10 necessitates cautious management. Mostly sore throat. No obvious shortness of breath or pulmonary risk factors  - Recommend OTC ibuprofen and acetaminophen to alternate every three hours for symptom relief. - Order Paxlovid  for use if symptoms do not improve by Sunday. May start within 5 day onset symptoms. - Start Tessalon  Perls take 1 capsule up to 3 times a day as needed for cough - Start Atrovent  nasal spray decongestant 2 sprays in each nostril up to 4 times daily for 7 days - Advise monitoring symptoms and contacting office if symptoms worsen or persist by weekend. - Instruct to wear a mask if coughing or febrile and follow up if symptoms persist or worsen.        Orders Placed This Encounter  Procedures   POCT rapid strep A    Associate with J02.9   POC COVID-19  BinaxNow    Meds ordered this encounter   Medications   nirmatrelvir /ritonavir  (PAXLOVID ) 20 x 150 MG & 10 x 100MG  TABS    Sig: Take 3 tablets by mouth 2 (two) times daily for 5 days. (Take nirmatrelvir  150 mg two tablets twice daily for 5 days and ritonavir  100 mg one tablet twice daily for 5 days) Patient GFR is >65    Dispense:  30 tablet    Refill:  0   ipratropium (ATROVENT ) 0.06 % nasal spray    Sig: Place 2 sprays into both nostrils 4 (four) times daily. For up to 5-7 days then stop.    Dispense:  15 mL    Refill:  0   benzonatate  (TESSALON ) 100 MG capsule    Sig: Take 1 capsule (100 mg total) by mouth 3 (three) times daily as needed for cough.    Dispense:  30 capsule    Refill:  0    Follow up plan: Return if symptoms worsen or fail to improve.   Marsa Officer, DO [facility] 02/18/2024, 2:59 PM

## 2024-02-18 NOTE — Patient Instructions (Addendum)
 Thank you for coming to the office today.  COVID Positive  Start Atrovent  nasal spray decongestant 2 sprays in each nostril up to 4 times daily for 7 days  Start Tessalon  Perls take 1 capsule up to 3 times a day as needed for cough  - For symptom control (these are optional OTC medicines)      - Take Ibuprofen / Advil 400-600mg  every 6-8 hours as needed for fever / muscle aches, and may also take Tylenol 500-1000mg  per dose every 6-8 hours or 3 times a day, can alternate dosing     - May try OTC Mucinex up to 7-10 days then stop  - Wash hands and cover cough very well to avoid spread of infection - Improve hydration with plenty of clear fluids   IF NOT IMPROVING BY SUNDAY OKAY TO TAKE PAXLOVID  COVID medication anti viral  If significant worsening with poor fluid intake, worsening fever, difficulty breathing due to coughing, worsening body aches, weakness, or other more concerning symptoms difficulty breathing you can seek treatment at Emergency Department. Also if improved flu symptoms and then worsening days to week later with concerns for bronchitis, productive cough fever chills again we may need to check for possible pneumonia that can occur after the flu    Please schedule a Follow-up Appointment to: No follow-ups on file.  If you have any other questions or concerns, please feel free to call the office or send a message through MyChart. You may also schedule an earlier appointment if necessary.  Additionally, you may be receiving a survey about your experience at our office within a few days to 1 week by e-mail or mail. We value your feedback.  Marsa Officer, DO [facility], [place]

## 2024-02-18 NOTE — Telephone Encounter (Signed)
 FYI Only or Action Required?: Action required by provider: request for appointment.  Patient was last seen in primary care on 11/10/2022 by Antonette Angeline ORN, NP.  Called Nurse Triage reporting Sore Throat.  Symptoms began yesterday.  Interventions attempted: OTC medications: Tylenol.  Symptoms are: gradually worsening. Sore throat, chills, sweating  Triage Disposition: See Physician Within 24 Hours  Patient/caregiver understands and will follow disposition?: Yes   Copied from CRM #[id number]. Topic: Clinical - Red Word Triage >> Feb 18, 2024  2:00 PM Marissa P wrote: Red Word that prompted transfer to Nurse Triage: Patient stated yesterday chills and throat sore, woke up today and had some sweating lots of it actually. As well as runny nose and coughing. Throat is sore some pain. Reason for Disposition  SEVERE throat pain (e.g., excruciating)  Answer Assessment - Initial Assessment Questions 1. ONSET: When did the throat start hurting? (Hours or days ago)      Last night 2. SEVERITY: How bad is the sore throat? (Scale 1-10; mild, moderate or severe)     moderate 3. STREP EXPOSURE: Has there been any exposure to strep within the past week? If Yes, ask: What type of contact occurred?      no 4.  VIRAL SYMPTOMS: Are there any symptoms of a cold, such as a runny nose, cough, hoarse voice or red eyes?      Sweating, chills, cough 5. FEVER: Do you have a fever? If Yes, ask: What is your temperature, how was it measured, and when did it start?     no 6. PUS ON THE TONSILS: Is there pus on the tonsils in the back of your throat?     no 7. OTHER SYMPTOMS: Do you have any other symptoms? (e.g., difficulty breathing, headache, rash)     no 8. PREGNANCY: Is there any chance you are pregnant? When was your last menstrual period?     no  Protocols used: Sore Throat-A-AH

## 2024-02-21 ENCOUNTER — Telehealth: Payer: Self-pay

## 2024-02-21 DIAGNOSIS — R051 Acute cough: Secondary | ICD-10-CM

## 2024-02-21 DIAGNOSIS — U071 COVID-19: Secondary | ICD-10-CM

## 2024-02-21 MED ORDER — GUAIFENESIN-CODEINE 100-10 MG/5ML PO SOLN: 5.0000 mL | Freq: Three times a day (TID) | ORAL | 0 refills | Status: AC | PRN

## 2024-02-21 NOTE — Telephone Encounter (Signed)
 Copied from CRM #[id number]. Topic: Clinical - Red Word Triage >> Feb 21, 2024  1:17 PM Haley Morris wrote: Patient called.. said med she was prescribed on Friday medication for COVID ( cough ) .Haley Morris Says she needs something else.. it isn't working - has persistant cough.  ( Okay w/ syrup ) and can she take both meds or will she only take the syrup.. pls call patient

## 2024-02-21 NOTE — Telephone Encounter (Signed)
 Called patient. Improved but still has cough. She is now taking Ibuprofen in addition to Tylenol as advised. For sore throat. I advised her that this is best option for sore throat pain. I don't have rx for sore throat.  Tessalon  perls not effective. I will send rx Codeine  cough syrup AS NEEDED use.   Finish Paxlovid . Defer other rx no antibiotic at this time. Finish paxlovid , if still not improving can contact back later this week if symptoms change or become suggestive of infection.  Marsa Officer, DO [facility] 02/21/2024, 4:23 PM

## 2024-02-21 NOTE — Addendum Note (Signed)
 Addended by: EDMAN MARSA PARAS on: 02/21/2024 04:23 PM   Modules accepted: Orders

## 2024-02-21 NOTE — Telephone Encounter (Signed)
 Duplicate call. See other telephone note today  Marsa Officer, DO [facility] 02/21/2024, 4:23 PM

## 2024-02-21 NOTE — Telephone Encounter (Signed)
 Copied from CRM #[id number]. Topic: Clinical - Red Word Triage >> Feb 21, 2024  2:30 PM Winona R wrote: Pt calling back to also request medication for sore throat. Pt would also like to know if she should also take the medication she was given before for cough.

## 2024-03-02 ENCOUNTER — Other Ambulatory Visit

## 2024-03-02 ENCOUNTER — Encounter

## 2024-03-07 ENCOUNTER — Ambulatory Visit (INDEPENDENT_AMBULATORY_CARE_PROVIDER_SITE_OTHER): Admitting: Internal Medicine

## 2024-03-07 ENCOUNTER — Encounter: Payer: Self-pay | Admitting: Internal Medicine

## 2024-03-07 VITALS — BP 128/80 | Temp 98.0°F | Ht 62.0 in | Wt 206.8 lb

## 2024-03-07 DIAGNOSIS — Z6837 Body mass index (BMI) 37.0-37.9, adult: Secondary | ICD-10-CM

## 2024-03-07 DIAGNOSIS — R7303 Prediabetes: Secondary | ICD-10-CM | POA: Diagnosis not present

## 2024-03-07 DIAGNOSIS — E782 Mixed hyperlipidemia: Secondary | ICD-10-CM

## 2024-03-07 DIAGNOSIS — Z1231 Encounter for screening mammogram for malignant neoplasm of breast: Secondary | ICD-10-CM | POA: Diagnosis not present

## 2024-03-07 DIAGNOSIS — E66812 Obesity, class 2: Secondary | ICD-10-CM | POA: Diagnosis not present

## 2024-03-07 DIAGNOSIS — Z780 Asymptomatic menopausal state: Secondary | ICD-10-CM

## 2024-03-07 DIAGNOSIS — Z0001 Encounter for general adult medical examination with abnormal findings: Secondary | ICD-10-CM | POA: Diagnosis not present

## 2024-03-07 LAB — HEMOGLOBIN A1C
Hgb A1c MFr Bld: 6 % — ABNORMAL HIGH (ref <5.7)
Mean Plasma Glucose: 126 mg/dL
eAG (mmol/L): 7 mmol/L

## 2024-03-07 LAB — COMPREHENSIVE METABOLIC PANEL WITH GFR
AG Ratio: 1.7 (calc) (ref 1.0–2.5)
ALT: 26 U/L (ref 6–29)
AST: 21 U/L (ref 10–35)
Albumin: 4.3 g/dL (ref 3.6–5.1)
Alkaline phosphatase (APISO): 66 U/L (ref 37–153)
BUN/Creatinine Ratio: 40 (calc) — ABNORMAL HIGH (ref 6–22)
BUN: 26 mg/dL — ABNORMAL HIGH (ref 7–25)
CO2: 26 mmol/L (ref 20–32)
Calcium: 9.4 mg/dL (ref 8.6–10.4)
Chloride: 106 mmol/L (ref 98–110)
Creat: 0.65 mg/dL (ref 0.60–1.00)
Globulin: 2.5 g/dL (ref 1.9–3.7)
Glucose, Bld: 109 mg/dL — ABNORMAL HIGH (ref 65–99)
Potassium: 4.1 mmol/L (ref 3.5–5.3)
Sodium: 140 mmol/L (ref 135–146)
Total Bilirubin: 0.9 mg/dL (ref 0.2–1.2)
Total Protein: 6.8 g/dL (ref 6.1–8.1)
eGFR: 93 mL/min/1.73m2 (ref >=60)

## 2024-03-07 LAB — LIPID PANEL
Cholesterol: 243 mg/dL — ABNORMAL HIGH (ref <200)
HDL: 48 mg/dL — ABNORMAL LOW (ref >=50)
LDL Cholesterol (Calc): 165 mg/dL — ABNORMAL HIGH
Non-HDL Cholesterol (Calc): 195 mg/dL — ABNORMAL HIGH (ref <130)
Total CHOL/HDL Ratio: 5.1 (calc) — ABNORMAL HIGH (ref <5.0)
Triglycerides: 152 mg/dL — ABNORMAL HIGH (ref <150)

## 2024-03-07 LAB — CBC
HCT: 37 % (ref 35.0–45.0)
Hemoglobin: 12.3 g/dL (ref 11.7–15.5)
MCH: 29.8 pg (ref 27.0–33.0)
MCHC: 33.2 g/dL (ref 32.0–36.0)
MCV: 89.6 fL (ref 80.0–100.0)
MPV: 10.8 fL (ref 7.5–12.5)
Platelets: 336 Thousand/uL (ref 140–400)
RBC: 4.13 Million/uL (ref 3.80–5.10)
RDW: 12.7 % (ref 11.0–15.0)
WBC: 7.4 Thousand/uL (ref 3.8–10.8)

## 2024-03-07 NOTE — Progress Notes (Signed)
 Subjective:    Patient ID: Haley Morris, female    DOB: June 07, 1951, 73 y.o.   MRN: [id number]  HPI  Pt presents to the clinic today for her annual exam.  Flu: Never Tetanus: >10 years ago Covid: x 5 Pneumovax: Never Prevnar: Never Shingrix: Never Pap smear: Hysterectomy Mammogram: 09/2022 Bone density: 08/2019 Colon screening: 11/2022, cologuard Vision screening: annually Dentist: biannually  Diet: She does eat meat. She consumes fruits and veggies. She does eat fried foods. She drinks mostly water. Exercise: None  Review of Systems     Past Medical History:  Diagnosis Date   Arthritis    knees    Current Outpatient Medications  Medication Sig Dispense Refill   acetaminophen (TYLENOL) 500 MG tablet Take 500 mg by mouth every 6 (six) hours as needed.     Ascorbic Acid (VITAMIN C PO) Take by mouth daily.     B Complex Vitamins (VITAMIN B COMPLEX PO) Take by mouth daily.     benzonatate  (TESSALON ) 100 MG capsule Take 1 capsule (100 mg total) by mouth 3 (three) times daily as needed for cough. 30 capsule 0   Calcium  Carb-Cholecalciferol (CALCIUM  1000 + D PO) Take by mouth.     CINNAMON PO Take by mouth daily.     ELDERBERRY PO Take by mouth.     Ginger, Zingiber officinalis, (GINGER ROOT) 550 MG CAPS Take by mouth.      guaiFENesin -codeine  (VIRTUSSIN A/C) 100-10 MG/5ML syrup Take 5 mLs by mouth 3 (three) times daily as needed. 120 mL 0   ibuprofen (ADVIL) 400 MG tablet Take by mouth.     ipratropium (ATROVENT ) 0.06 % nasal spray Place 2 sprays into both nostrils 4 (four) times daily. For up to 5-7 days then stop. 15 mL 0   Multiple Vitamin (MULTIVITAMIN) tablet Take 1 tablet by mouth daily.     TURMERIC PO Take by mouth daily.     No current facility-administered medications for this visit.    Allergies  Allergen Reactions   Cefdinir Swelling    throat closes   Erythromycin     Cousin is allergic   Hydromet [Hydrocodone Bit-Homatrop Mbr] Nausea And Vomiting     Family History  Problem Relation Age of Onset   Heart attack Father    Lung cancer Brother     Social History   Socioeconomic History   Marital status: Widowed    Spouse name: Not on file   Number of children: 1   Years of education: Not on file   Highest education level: Not on file  Occupational History   Occupation: retired   Tobacco Use   Smoking status: Former   Smokeless tobacco: Never   Tobacco comments:    socially in her 53s  Vaping Use   Vaping status: Never Used  Substance and Sexual Activity   Alcohol use: Not Currently   Drug use: Not Currently   Sexual activity: Not Currently  Other Topics Concern   Not on file  Social History Narrative   Not on file   Social Drivers of Health   Financial Resource Strain: Low Risk  (01/14/2024)   Overall Financial Resource Strain (CARDIA)    Difficulty of Paying Living Expenses: Not hard at all  Food Insecurity: No Food Insecurity (01/14/2024)   Hunger Vital Sign    Worried About Running Out of Food in the Last Year: Never true    Ran Out of Food in the Last Year: Never true  Transportation Needs: No Transportation Needs (01/14/2024)   PRAPARE - Administrator, Civil Service (Medical): No    Lack of Transportation (Non-Medical): No  Physical Activity: Sufficiently Active (01/14/2024)   Exercise Vital Sign    Days of Exercise per Week: 7 days    Minutes of Exercise per Session: 60 min  Stress: No Stress Concern Present (01/14/2024)   [employer] of Occupational Health - Occupational Stress Questionnaire    Feeling of Stress: Not at all  Social Connections: Moderately Isolated (01/14/2024)   Social Connection and Isolation Panel    Frequency of Communication with Friends and Family: More than three times a week    Frequency of Social Gatherings with Friends and Family: More than three times a week    Attends Religious Services: More than 4 times per year    Active Member of [employer] or Organizations: No     Attends Banker Meetings: Never    Marital Status: Widowed  Intimate Partner Violence: Not At Risk (01/14/2024)   Humiliation, Afraid, Rape, and Kick questionnaire    Fear of Current or Ex-Partner: No    Emotionally Abused: No    Physically Abused: No    Sexually Abused: No     Constitutional: Denies fever, malaise, fatigue, headache or abrupt weight changes.  HEENT: Denies eye pain, eye redness, ear pain, ringing in the ears, wax buildup, runny nose, nasal congestion, bloody nose, or sore throat. Respiratory: Denies difficulty breathing, shortness of breath, cough or sputum production.   Cardiovascular: Denies chest pain, chest tightness, palpitations or swelling in the hands or feet.  Gastrointestinal: Denies abdominal pain, bloating, constipation, diarrhea or blood in the stool.  GU: Denies urgency, frequency, pain with urination, burning sensation, blood in urine, odor or discharge. Musculoskeletal: Patient reports joint pain in back and knees.  Denies decrease in range of motion, difficulty with gait, muscle pain or joint swelling.  Skin: Denies redness, rashes, lesions or ulcercations.  Neurological: Denies dizziness, difficulty with memory, difficulty with speech or problems with balance and coordination.  Psych: Denies anxiety, depression, SI/HI.  No other specific complaints in a complete review of systems (except as listed in HPI above).  Objective:   Physical Exam BP 128/80 (BP Location: Right Arm, Patient Position: Sitting, Cuff Size: Normal)   Temp 98 F (36.7 C)   Ht 5' 2 (1.575 m)   Wt 206 lb 12.8 oz (93.8 kg)   BMI 37.82 kg/m    Wt Readings from Last 3 Encounters:  02/18/24 208 lb 2 oz (94.4 kg)  11/10/22 201 lb (91.2 kg)  09/10/22 203 lb 3.2 oz (92.2 kg)    General: Appears her stated age, obese, in NAD. Skin: Warm, dry and intact.  HEENT: Head: normal shape and size; Eyes: sclera white, no icterus, conjunctiva pink, PERRLA, strabismus noted;   Neck:  Neck supple, trachea midline. No masses, lumps or thyromegaly present.  Cardiovascular: Tachycardic with normal rhythm. S1,S2 noted.  No murmur, rubs or gallops noted. No JVD or BLE edema. No carotid bruits noted. Pulmonary/Chest: Normal effort and positive vesicular breath sounds. No respiratory distress. No wheezes, rales or ronchi noted.  Abdomen: Soft and nontender. Normal bowel sounds.  Musculoskeletal: Strength 5/5 BUE. Gait slow and steady with use of cane. Neurological: Alert and oriented. Cranial nerves II-XII grossly intact. Coordination normal.  Psychiatric: Mood and affect normal. Behavior is normal. Judgment and thought content normal.     BMET    Component Value Date/Time  NA 140 11/10/2022 1512   K 4.1 11/10/2022 1512   CL 106 11/10/2022 1512   CO2 22 11/10/2022 1512   GLUCOSE 118 (H) 11/10/2022 1512   BUN 19 11/10/2022 1512   CREATININE 0.83 11/10/2022 1512   CALCIUM  9.7 11/10/2022 1512   GFRNONAA 82 01/18/2020 1033   GFRAA 95 01/18/2020 1033    Lipid Panel     Component Value Date/Time   CHOL 260 (H) 11/10/2022 1512   TRIG 127 11/10/2022 1512   HDL 56 11/10/2022 1512   CHOLHDL 4.6 11/10/2022 1512   LDLCALC 178 (H) 11/10/2022 1512    CBC    Component Value Date/Time   WBC 7.5 11/10/2022 1512   RBC 4.34 11/10/2022 1512   HGB 13.4 11/10/2022 1512   HCT 38.9 11/10/2022 1512   PLT 301 11/10/2022 1512   MCV 89.6 11/10/2022 1512   MCH 30.9 11/10/2022 1512   MCHC 34.4 11/10/2022 1512   RDW 12.5 11/10/2022 1512   LYMPHSABS 2,240 01/18/2020 1033   EOSABS 152 01/18/2020 1033   BASOSABS 32 01/18/2020 1033    Hgb A1C Lab Results  Component Value Date   HGBA1C 5.9 (H) 11/10/2022           Assessment & Plan:   Preventative Health Maintenance:  She declines flu shot She declines tetanus shot Encouraged her to get her COVID booster She declines Pneumovax or Prevnar She declines Shingrix She no longer needs to screen for cervical  cancer Mammogram and bone density ordered- she will call to schedule Colon screening UTD Encouraged her to consume a balanced diet and exercise regimen Advised her to see an eye doctor and dentist annually We will check CBC, c-Met, lipid and A1c today  RTC in 6 months, follow-up chronic conditions Angeline Laura, NP

## 2024-03-07 NOTE — Patient Instructions (Signed)
 Health Maintenance for Postmenopausal Women Menopause is a normal process in which your ability to get pregnant comes to an end. This process happens slowly over many months or years, usually between the ages of 76 and 38. Menopause is complete when you have missed your menstrual period for 12 months. It is important to talk with your health care provider about some of the most common conditions that affect women after menopause (postmenopausal women). These include heart disease, cancer, and bone loss (osteoporosis). Adopting a healthy lifestyle and getting preventive care can help to promote your health and wellness. The actions you take can also lower your chances of developing some of these common conditions. What are the signs and symptoms of menopause? During menopause, you may have the following symptoms: Hot flashes. These can be moderate or severe. Night sweats. Decrease in sex drive. Mood swings. Headaches. Tiredness (fatigue). Irritability. Memory problems. Problems falling asleep or staying asleep. Talk with your health care provider about treatment options for your symptoms. Do I need hormone replacement therapy? Hormone replacement therapy is effective in treating symptoms that are caused by menopause, such as hot flashes and night sweats. Hormone replacement carries certain risks, especially as you become older. If you are thinking about using estrogen or estrogen with progestin, discuss the benefits and risks with your health care provider. How can I reduce my risk for heart disease and stroke? The risk of heart disease, heart attack, and stroke increases as you age. One of the causes may be a change in the body's hormones during menopause. This can affect how your body uses dietary fats, triglycerides, and cholesterol. Heart attack and stroke are medical emergencies. There are many things that you can do to help prevent heart disease and stroke. Watch your blood pressure High  blood pressure causes heart disease and increases the risk of stroke. This is more likely to develop in people who have high blood pressure readings or are overweight. Have your blood pressure checked: Every 3-5 years if you are 32-23 years of age. Every year if you are 31 years old or older. Eat a healthy diet  Eat a diet that includes plenty of vegetables, fruits, low-fat dairy products, and lean protein. Do not eat a lot of foods that are high in solid fats, added sugars, or sodium. Get regular exercise Get regular exercise. This is one of the most important things you can do for your health. Most adults should: Try to exercise for at least 150 minutes each week. The exercise should increase your heart rate and make you sweat (moderate-intensity exercise). Try to do strengthening exercises at least twice each week. Do these in addition to the moderate-intensity exercise. Spend less time sitting. Even light physical activity can be beneficial. Other tips Work with your health care provider to achieve or maintain a healthy weight. Do not use any products that contain nicotine or tobacco. These products include cigarettes, chewing tobacco, and vaping devices, such as e-cigarettes. If you need help quitting, ask your health care provider. Know your numbers. Ask your health care provider to check your cholesterol and your blood sugar (glucose). Continue to have your blood tested as directed by your health care provider. Do I need screening for cancer? Depending on your health history and family history, you may need to have cancer screenings at different stages of your life. This may include screening for: Breast cancer. Cervical cancer. Lung cancer. Colorectal cancer. What is my risk for osteoporosis? After menopause, you may be  at increased risk for osteoporosis. Osteoporosis is a condition in which bone destruction happens more quickly than new bone creation. To help prevent osteoporosis or  the bone fractures that can happen because of osteoporosis, you may take the following actions: If you are 24-54 years old, get at least 1,000 mg of calcium and at least 600 international units (IU) of vitamin D  per day. If you are older than age 75 but younger than age 30, get at least 1,200 mg of calcium and at least 600 international units (IU) of vitamin D  per day. If you are older than age 8, get at least 1,200 mg of calcium and at least 800 international units (IU) of vitamin D  per day. Smoking and drinking excessive alcohol increase the risk of osteoporosis. Eat foods that are rich in calcium and vitamin D , and do weight-bearing exercises several times each week as directed by your health care provider. How does menopause affect my mental health? Depression may occur at any age, but it is more common as you become older. Common symptoms of depression include: Feeling depressed. Changes in sleep patterns. Changes in appetite or eating patterns. Feeling an overall lack of motivation or enjoyment of activities that you previously enjoyed. Frequent crying spells. Talk with your health care provider if you think that you are experiencing any of these symptoms. General instructions See your health care provider for regular wellness exams and vaccines. This may include: Scheduling regular health, dental, and eye exams. Getting and maintaining your vaccines. These include: Influenza vaccine. Get this vaccine each year before the flu season begins. Pneumonia vaccine. Shingles vaccine. Tetanus, diphtheria, and pertussis (Tdap) booster vaccine. Your health care provider may also recommend other immunizations. Tell your health care provider if you have ever been abused or do not feel safe at home. Summary Menopause is a normal process in which your ability to get pregnant comes to an end. This condition causes hot flashes, night sweats, decreased interest in sex, mood swings, headaches, or lack  of sleep. Treatment for this condition may include hormone replacement therapy. Take actions to keep yourself healthy, including exercising regularly, eating a healthy diet, watching your weight, and checking your blood pressure and blood sugar levels. Get screened for cancer and depression. Make sure that you are up to date with all your vaccines. This information is not intended to replace advice given to you by your health care provider. Make sure you discuss any questions you have with your health care provider. Document Revised: 10/21/2020 Document Reviewed: 10/21/2020 Elsevier Patient Education  2024 [employer].

## 2024-03-07 NOTE — Assessment & Plan Note (Signed)
 Encouraged diet and exercise for weight loss ?

## 2024-03-08 ENCOUNTER — Ambulatory Visit: Payer: Self-pay | Admitting: Internal Medicine

## 2024-03-09 ENCOUNTER — Telehealth: Payer: Self-pay

## 2024-03-09 DIAGNOSIS — E782 Mixed hyperlipidemia: Secondary | ICD-10-CM

## 2024-03-09 MED ORDER — ATORVASTATIN CALCIUM 20 MG PO TABS: 20.0000 mg | ORAL_TABLET | Freq: Every day | ORAL | 0 refills | Status: AC

## 2024-03-09 NOTE — Telephone Encounter (Signed)
 Copied from CRM #[id number]. Topic: Clinical - Medical Advice >> Mar 09, 2024  9:58 AM Amy B wrote: Reason for CRM: Patient called for lab results.  Relayed information to patient.  Provider recommends patient to have cholesterol lowering medication.  Patient is agreeable to this and would like to start medication.

## 2024-03-09 NOTE — Telephone Encounter (Signed)
 Atorvastatin  sent to pharmacy. Please schedule her in 3 months for lab only appt to recheck lipids

## 2024-03-09 NOTE — Telephone Encounter (Signed)
 Left message for patient to return call OK  to schedule lab only appt

## 2024-03-09 NOTE — Addendum Note (Signed)
 Addended by: ANTONETTE ANGELINE ORN on: 03/09/2024 11:11 AM   Modules accepted: Orders

## 2024-03-10 ENCOUNTER — Telehealth: Payer: Self-pay

## 2024-03-10 NOTE — Telephone Encounter (Signed)
 Patient denies any fevers, just a cough. Feels better. Advised after masking for the first 10 days, she can resume as normal. No further questions or concerns.

## 2024-03-10 NOTE — Telephone Encounter (Signed)
 Copied from CRM #[id number]. Topic: Clinical - Medical Advice >> Mar 10, 2024 11:46 AM Haley Morris wrote: Reason for CRM: The patient tested positive for COVID on September 5 and is inquiring about when it is safe to resume going out. Patient is requesting a callback at [phone]

## 2024-03-13 DIAGNOSIS — Z96651 Presence of right artificial knee joint: Secondary | ICD-10-CM | POA: Diagnosis not present

## 2024-03-13 DIAGNOSIS — M1712 Unilateral primary osteoarthritis, left knee: Secondary | ICD-10-CM | POA: Diagnosis not present

## 2024-09-05 ENCOUNTER — Ambulatory Visit: Admitting: Internal Medicine

## 2025-01-19 ENCOUNTER — Ambulatory Visit

## 2025-01-24 ENCOUNTER — Ambulatory Visit
